# Patient Record
Sex: Male | Born: 1950 | Race: White | Hispanic: No | Marital: Married | Smoking: Former smoker
Health system: Southern US, Community
[De-identification: ages and names within clinical notes are randomized; demographics above are authoritative.]

## PROBLEM LIST (undated history)

## (undated) DIAGNOSIS — F419 Anxiety disorder, unspecified: Secondary | ICD-10-CM

## (undated) DIAGNOSIS — J45909 Unspecified asthma, uncomplicated: Secondary | ICD-10-CM

## (undated) DIAGNOSIS — Z8709 Personal history of other diseases of the respiratory system: Secondary | ICD-10-CM

## (undated) DIAGNOSIS — M1990 Unspecified osteoarthritis, unspecified site: Secondary | ICD-10-CM

## (undated) DIAGNOSIS — I10 Essential (primary) hypertension: Secondary | ICD-10-CM

## (undated) HISTORY — PX: OTHER SURGICAL HISTORY: SHX169

## (undated) HISTORY — PX: FRACTURE SURGERY: SHX138

## (undated) HISTORY — PX: CERVICAL FUSION: SHX112

## (undated) HISTORY — PX: COLONOSCOPY: SHX174

## (undated) HISTORY — PX: TONSILLECTOMY: SUR1361

## (undated) HISTORY — PX: BACK SURGERY: SHX140

---

## 2001-09-28 ENCOUNTER — Ambulatory Visit (HOSPITAL_COMMUNITY): Admission: RE | Admit: 2001-09-28 | Discharge: 2001-09-28 | Payer: Self-pay | Admitting: Plastic Surgery

## 2003-07-26 ENCOUNTER — Emergency Department (HOSPITAL_COMMUNITY): Admission: EM | Admit: 2003-07-26 | Discharge: 2003-07-26 | Payer: Self-pay | Admitting: Emergency Medicine

## 2006-09-11 ENCOUNTER — Ambulatory Visit (HOSPITAL_COMMUNITY): Admission: RE | Admit: 2006-09-11 | Discharge: 2006-09-12 | Payer: Self-pay | Admitting: Neurosurgery

## 2009-01-08 ENCOUNTER — Encounter: Admission: RE | Admit: 2009-01-08 | Discharge: 2009-01-08 | Payer: Self-pay | Admitting: Internal Medicine

## 2011-02-11 NOTE — Op Note (Signed)
NAMEMARQUET, FAIRCLOTH                ACCOUNT NO.:  [id number]   MEDICAL RECORD NO.:  [id number]          PATIENT TYPE:  OIB   LOCATION:  3013                         FACILITY:  [facility]   PHYSICIAN:  Hewitt Shorts, M.D.DATE OF BIRTH:  [birth year]   DATE OF PROCEDURE:  09/11/2006  DATE OF DISCHARGE:                               OPERATIVE REPORT   PREOPERATIVE DIAGNOSES:  C5-6 and C6-7 cervical disk herniation,  cervical spondylosis, cervical degenerative disk disease, and cervical  radiculopathy.   POSTOPERATIVE DIAGNOSES:  C5-6 and C6-7 cervical disk herniation,  cervical spondylosis, cervical degenerative disk disease, and cervical  radiculopathy.   PROCEDURE:  C5-6 and C6-7 anterior cervical diskectomy, arthrodesis with  allograft and Tether cervical plating.   SURGEON:  Hewitt Shorts, M.D.   ASSISTANT:  Webb Silversmith, RN; and Channing Mutters.   ANESTHESIA:  General endotracheal.   INDICATIONS:  Patient is a 60 year old man who presented with right  cervical radiculopathy and was found to have spondylitic disk herniation  superimposed upon underlying spondylosis and degenerative disk disease  at both C5-6 and C6-7 with a soft disk herniation out to the right at C5-  6 as well.  It was felt that these degenerative changes were responsible  for his radiculopathy, and the decision was made to proceed with a two-  level anterior cervical diskectomy and arthrodesis.   PROCEDURE:  The patient was brought to the operating room and placed  under general endotracheal anesthesia.  The patient was placed on 10  pounds of halter traction.  The neck was prepped with Betadine and soapy  solution, and draped in a sterile fashion.  A horizontal incision was  made in the left side of the neck.  The line of the incision was  infiltrated with local anesthetic with epinephrine.  Dissection was  carried down to the subcutaneous tissue and platysma, and bipolar  cautery was used to maintain hemostasis.   Dissection was then carried  down to an avascular plane, leaving the sternocleidomastoid, carotid  artery and jugular vein laterally, and the trachea and esophagus  medially.  The ventral aspect of the vertebral column was identified and  a localization x-ray taken.  The C5-6 and C6-7 intervertebral disk  spaces were identified.  Diskectomy was begun at each level with  incision of the annulus and continued with Mitek curettes and pituitary  rongeurs.  There was osteophytic overgrowth present anteriorly that was  removed using an osteophyte removal tool and double-action rongeur.  The  microscope was draped and brought into the field to provide additional  magnification, illumination, and visualization, and the remainder of the  decompression was performed using microdissection and microsurgical  technique.  The cauterized endplates of the corresponding vertebrae were  removed using Mitek curettes along with the XMax drill, and posterior  osteophytic overgrowth at each level was removed using the XMax drill  along with a 2-mm Kerrison punch with a thin foot plate.  The posterior  longitudinal ligament was carefully opened at each level and the  spondylitic, thickened posterior longitudinal ligament was carefully  removed.  We removed the  disk herniation to the right at C5-6.  In the  end, good decompression of the spinal canal and thecal sac, as well as  of the neuroforamina and nerve roots exiting bilaterally was achieved.   Once the decompression was completed, hemostasis was established with  the use of Gelfoam soaked in thrombin.  We measured the height of the  intervertebral disk spaces and selected an 8-mm allograft for the C5-6  level and a 7-mm allograft for the C6-7 level.  Each allograft was  hydrated in saline solution and then carefully positioned in the  intervertebral disk space and counter-sunk.  We then selected a 35-mm  Tether cervical plate.  Traction was discontinued,  and we secured the  plate to the vertebra with 4 x 15 mm variable angle screws, placing a  pair of screws at C5 and another pair of screws at C7 and a single screw  at C6.  Each screw hole was drilled and tapped and the screws placed in  alternating fashion and then once all five screws were placed, final  tightening was performed.   The wound was irrigated with bacitracin solution.  An x-ray was taken.  It only visualized the C5-6 level and the graft and screws appeared in  good position, and the alignment appeared good.  The wound was again  irrigated with bacitracin solution, checked for hemostasis which was  established and confirmed, and then we proceeded with closure.  The  platysma was closed with interrupted inverted 2-0 undyed Vicryl sutures.  The subcutaneous and subcuticular were closed with interrupted inverted  3-0 undyed Vicryl sutures.  The skin was reapproximated with Dermabond.  The procedure was tolerated well.  The estimated blood loss was 50 cc.  Sponge and needle counts were correct.   Following surgery, the patient was placed in a soft cervical collar,  reversed anesthetic, extubated and transferred to the recovery room for  further care, where he was noted to be moving all four extremities to  command.      Hewitt Shorts, M.D.  Electronically Signed     RWN/MEDQ  D:  09/11/2006  T:  09/12/2006  Job:  [id number]

## 2014-06-19 ENCOUNTER — Other Ambulatory Visit: Payer: Self-pay | Admitting: Neurosurgery

## 2014-06-19 DIAGNOSIS — S129XXA Fracture of neck, unspecified, initial encounter: Secondary | ICD-10-CM

## 2014-06-23 ENCOUNTER — Ambulatory Visit
Admission: RE | Admit: 2014-06-23 | Discharge: 2014-06-23 | Disposition: A | Discharge status: Home / Self Care | Payer: BC Managed Care – PPO | Source: Ambulatory Visit | Attending: Neurosurgery | Admitting: Neurosurgery

## 2014-06-23 ENCOUNTER — Other Ambulatory Visit: Payer: Self-pay | Admitting: Neurosurgery

## 2014-06-23 DIAGNOSIS — S129XXA Fracture of neck, unspecified, initial encounter: Secondary | ICD-10-CM

## 2014-10-07 ENCOUNTER — Other Ambulatory Visit (HOSPITAL_COMMUNITY): Payer: Self-pay | Admitting: Neurosurgery

## 2014-11-06 NOTE — Pre-Procedure Instructions (Signed)
Steve Perez  11/06/2014   Your procedure is scheduled on: Thursday, November 20, 2014   Report to [facility] Admitting at 5:30 AM.   Call this number if you have problems the morning of surgery: [phone]     Remember:   Do not eat food or drink liquids after midnight Wednesday, November 19, 2014    Take these medicines the morning of surgery with A SIP OF WATER: montelukast (SINGULAIR) sertraline (ZOLOFT), if needed:PROVENTIL inhaler for wheezing or shortness of breath, QVAR 80 MCG/ACT inhaler for shortness of breath ( bring inhalers in with you on day of procedure)  Stop taking Aspirin, vitamins, and herbal medications such as Omega-3 Fatty Acids (FISH OIL).  Do not take any NSAIDs ie: Ibuprofen, Advil, Naproxen or any medication containing Aspirin such as meloxicam (MOBIC) and VOLTAREN 1 % GEL   Do not wear jewelry, make-up or nail polish.  Do not wear lotions, powders, or perfumes. You may not wear deodorant.  Do not shave 48 hours prior to surgery. Men may shave face and neck.  Do not bring valuables to the hospital.  [facility] is not responsible for any belongings or valuables.               Contacts, dentures or bridgework may not be worn into surgery.  Leave suitcase in the car. After surgery it may be brought to your room.  For patients admitted to the hospital, discharge time is determined by your treatment team.               Patients discharged the day of surgery will not be allowed to drive home.    Special Instructions:  Special Instructions:Special Instructions: [place] - Preparing for Surgery  Before surgery, you can play an important role.  Because skin is not sterile, your skin needs to be as free of germs as possible.  You can reduce the number of germs on you skin by washing with CHG (chlorahexidine gluconate) soap before surgery.  CHG is an antiseptic cleaner which kills germs and bonds with the skin to continue killing germs even after  washing.  Please DO NOT use if you have an allergy to CHG or antibacterial soaps.  If your skin becomes reddened/irritated stop using the CHG and inform your nurse when you arrive at Short Stay.  Do not shave (including legs and underarms) for at least 48 hours prior to the first CHG shower.  You may shave your face.  Please follow these instructions carefully:   1.  Shower with CHG Soap the night before surgery and the morning of Surgery.  2.  If you choose to wash your hair, wash your hair first as usual with your normal shampoo.  3.  After you shampoo, rinse your hair and body thoroughly to remove the Shampoo.  4.  Use CHG as you would any other liquid soap.  You can apply chg directly  to the skin and wash gently with scrungie or a clean washcloth.  5.  Apply the CHG Soap to your body ONLY FROM THE NECK DOWN.  Do not use on open wounds or open sores.  Avoid contact with your eyes, ears, mouth and genitals (private parts).  Wash genitals (private parts) with your normal soap.  6.  Wash thoroughly, paying special attention to the area where your surgery will be performed.  7.  Thoroughly rinse your body with warm water from the neck down.  8.  DO NOT shower/wash  with your normal soap after using and rinsing off the CHG Soap.  9.  Pat yourself dry with a clean towel.            10.  Wear clean pajamas.            11.  Place clean sheets on your bed the night of your first shower and do not sleep with pets.  Day of Surgery  Do not apply any lotions/deodorants the morning of surgery.  Please wear clean clothes to the hospital/surgery center.   Please read over the following fact sheets that you were given: Pain Booklet, Coughing and Deep Breathing, MRSA Information and Surgical Site Infection Prevention

## 2014-11-07 ENCOUNTER — Encounter (HOSPITAL_COMMUNITY)
Admission: RE | Admit: 2014-11-07 | Discharge: 2014-11-07 | Disposition: A | Discharge status: Home / Self Care | Payer: Medicare HMO | Source: Ambulatory Visit | Attending: Neurosurgery | Admitting: Neurosurgery

## 2014-11-07 ENCOUNTER — Encounter (HOSPITAL_COMMUNITY): Payer: Self-pay

## 2014-11-07 DIAGNOSIS — M5020 Other cervical disc displacement, unspecified cervical region: Secondary | ICD-10-CM | POA: Insufficient documentation

## 2014-11-07 DIAGNOSIS — Z01810 Encounter for preprocedural cardiovascular examination: Secondary | ICD-10-CM | POA: Insufficient documentation

## 2014-11-07 DIAGNOSIS — Z01812 Encounter for preprocedural laboratory examination: Secondary | ICD-10-CM | POA: Insufficient documentation

## 2014-11-07 DIAGNOSIS — R001 Bradycardia, unspecified: Secondary | ICD-10-CM | POA: Insufficient documentation

## 2014-11-07 HISTORY — DX: Unspecified osteoarthritis, unspecified site: M19.90

## 2014-11-07 HISTORY — DX: Anxiety disorder, unspecified: F41.9

## 2014-11-07 HISTORY — DX: Unspecified asthma, uncomplicated: J45.909

## 2014-11-07 HISTORY — DX: Essential (primary) hypertension: I10

## 2014-11-07 LAB — SURGICAL PCR SCREEN
MRSA, PCR: NEGATIVE
Staphylococcus aureus: NEGATIVE

## 2014-11-07 LAB — BASIC METABOLIC PANEL
ANION GAP: 7 (ref 5–15)
BUN: 17 mg/dL (ref 6–23)
CO2: 30 mmol/L (ref 19–32)
CREATININE: 1.02 mg/dL (ref 0.50–1.35)
Calcium: 8.8 mg/dL (ref 8.4–10.5)
Chloride: 100 mmol/L (ref 96–112)
GFR calc Af Amer: 87 mL/min — ABNORMAL LOW (ref >90)
GFR calc non Af Amer: 75 mL/min — ABNORMAL LOW (ref >90)
Glucose, Bld: 101 mg/dL — ABNORMAL HIGH (ref 70–99)
Potassium: 4.9 mmol/L (ref 3.5–5.1)
Sodium: 137 mmol/L (ref 135–145)

## 2014-11-07 LAB — CBC
HEMATOCRIT: 40.7 % (ref 39.0–52.0)
HEMOGLOBIN: 13.6 g/dL (ref 13.0–17.0)
MCH: 32.5 pg (ref 26.0–34.0)
MCHC: 33.4 g/dL (ref 30.0–36.0)
MCV: 97.1 fL (ref 78.0–100.0)
PLATELETS: 192 10*3/uL (ref 150–400)
RBC: 4.19 MIL/uL — AB (ref 4.22–5.81)
RDW: 12.7 % (ref 11.5–15.5)
WBC: 5.8 10*3/uL (ref 4.0–10.5)

## 2014-11-19 MED ORDER — CEFAZOLIN SODIUM-DEXTROSE 2-3 GM-% IV SOLR
2.0000 g | INTRAVENOUS | Status: AC
  Administered 2014-11-20: 2 g via INTRAVENOUS
  Filled 2014-11-19: qty 50

## 2014-11-20 ENCOUNTER — Ambulatory Visit (HOSPITAL_COMMUNITY): Payer: Medicare HMO

## 2014-11-20 ENCOUNTER — Encounter (HOSPITAL_COMMUNITY): Payer: Self-pay | Admitting: Surgery

## 2014-11-20 ENCOUNTER — Encounter (HOSPITAL_COMMUNITY)
Admission: RE | Disposition: A | Discharge status: Home / Self Care | Payer: Self-pay | Source: Ambulatory Visit | Attending: Neurosurgery

## 2014-11-20 ENCOUNTER — Ambulatory Visit (HOSPITAL_COMMUNITY): Payer: Medicare HMO | Admitting: Anesthesiology

## 2014-11-20 ENCOUNTER — Observation Stay (HOSPITAL_COMMUNITY)
Admission: RE | Admit: 2014-11-20 | Discharge: 2014-11-21 | Disposition: A | Discharge status: Home / Self Care | Payer: Medicare HMO | Source: Ambulatory Visit | Attending: Neurosurgery | Admitting: Neurosurgery

## 2014-11-20 DIAGNOSIS — F17210 Nicotine dependence, cigarettes, uncomplicated: Secondary | ICD-10-CM | POA: Insufficient documentation

## 2014-11-20 DIAGNOSIS — I10 Essential (primary) hypertension: Secondary | ICD-10-CM | POA: Diagnosis not present

## 2014-11-20 DIAGNOSIS — Z419 Encounter for procedure for purposes other than remedying health state, unspecified: Secondary | ICD-10-CM

## 2014-11-20 DIAGNOSIS — F419 Anxiety disorder, unspecified: Secondary | ICD-10-CM | POA: Insufficient documentation

## 2014-11-20 DIAGNOSIS — M1990 Unspecified osteoarthritis, unspecified site: Secondary | ICD-10-CM | POA: Insufficient documentation

## 2014-11-20 DIAGNOSIS — M5020 Other cervical disc displacement, unspecified cervical region: Secondary | ICD-10-CM | POA: Diagnosis present

## 2014-11-20 DIAGNOSIS — M5012 Cervical disc disorder with radiculopathy, mid-cervical region: Secondary | ICD-10-CM | POA: Diagnosis not present

## 2014-11-20 DIAGNOSIS — Z7982 Long term (current) use of aspirin: Secondary | ICD-10-CM | POA: Insufficient documentation

## 2014-11-20 DIAGNOSIS — J45909 Unspecified asthma, uncomplicated: Secondary | ICD-10-CM | POA: Diagnosis not present

## 2014-11-20 HISTORY — PX: ANTERIOR CERVICAL DECOMP/DISCECTOMY FUSION: SHX1161

## 2014-11-20 SURGERY — ANTERIOR CERVICAL DECOMPRESSION/DISCECTOMY FUSION 2 LEVEL/HARDWARE REMOVAL
Anesthesia: General | Site: Spine Cervical

## 2014-11-20 MED ORDER — MIDAZOLAM HCL 5 MG/5ML IJ SOLN
INTRAMUSCULAR | Status: DC | PRN
  Administered 2014-11-20: 2 mg via INTRAVENOUS

## 2014-11-20 MED ORDER — OXYCODONE HCL 5 MG PO TABS: 5.0000 mg | ORAL_TABLET | Freq: Once | ORAL | Status: DC | PRN

## 2014-11-20 MED ORDER — GLYCOPYRROLATE 0.2 MG/ML IJ SOLN
INTRAMUSCULAR | Status: AC
  Filled 2014-11-20: qty 1

## 2014-11-20 MED ORDER — 0.9 % SODIUM CHLORIDE (POUR BTL) OPTIME
TOPICAL | Status: DC | PRN
  Administered 2014-11-20: 1000 mL

## 2014-11-20 MED ORDER — ALUM & MAG HYDROXIDE-SIMETH 200-200-20 MG/5ML PO SUSP
30.0000 mL | Freq: Four times a day (QID) | ORAL | Status: DC | PRN
  Administered 2014-11-21: 30 mL via ORAL
  Filled 2014-11-20: qty 30

## 2014-11-20 MED ORDER — BUPIVACAINE HCL (PF) 0.5 % IJ SOLN
INTRAMUSCULAR | Status: DC | PRN
Start: 2014-11-20 — End: 2014-11-20
  Administered 2014-11-20: 8 mL

## 2014-11-20 MED ORDER — OXYCODONE-ACETAMINOPHEN 5-325 MG PO TABS
1.0000 | ORAL_TABLET | ORAL | Status: DC | PRN
  Administered 2014-11-20 (×2): 2 via ORAL
  Filled 2014-11-20: qty 2

## 2014-11-20 MED ORDER — HYDROMORPHONE HCL 1 MG/ML IJ SOLN
0.2500 mg | INTRAMUSCULAR | Status: DC | PRN
  Administered 2014-11-20 (×4): 0.5 mg via INTRAVENOUS

## 2014-11-20 MED ORDER — BISACODYL 10 MG RE SUPP
10.0000 mg | Freq: Every day | RECTAL | Status: DC | PRN
Start: 2014-11-20 — End: 2014-11-21

## 2014-11-20 MED ORDER — ZOLPIDEM TARTRATE 5 MG PO TABS
5.0000 mg | ORAL_TABLET | Freq: Every evening | ORAL | Status: DC | PRN
  Administered 2014-11-20: 5 mg via ORAL
  Filled 2014-11-20: qty 1

## 2014-11-20 MED ORDER — ALBUTEROL SULFATE (2.5 MG/3ML) 0.083% IN NEBU: 2.5000 mg | INHALATION_SOLUTION | Freq: Four times a day (QID) | RESPIRATORY_TRACT | Status: DC | PRN

## 2014-11-20 MED ORDER — FENTANYL CITRATE 0.05 MG/ML IJ SOLN
INTRAMUSCULAR | Status: AC
  Filled 2014-11-20: qty 5

## 2014-11-20 MED ORDER — PHENOL 1.4 % MT LIQD: 1.0000 | OROMUCOSAL | Status: DC | PRN

## 2014-11-20 MED ORDER — SODIUM CHLORIDE 0.9 % IJ SOLN
3.0000 mL | Freq: Two times a day (BID) | INTRAMUSCULAR | Status: DC
  Administered 2014-11-20 (×2): 3 mL via INTRAVENOUS

## 2014-11-20 MED ORDER — GLYCOPYRROLATE 0.2 MG/ML IJ SOLN
INTRAMUSCULAR | Status: AC
  Filled 2014-11-20: qty 4

## 2014-11-20 MED ORDER — LACTATED RINGERS IV SOLN
INTRAVENOUS | Status: DC | PRN
  Administered 2014-11-20 (×2): via INTRAVENOUS

## 2014-11-20 MED ORDER — ARTIFICIAL TEARS OP OINT
TOPICAL_OINTMENT | OPHTHALMIC | Status: AC
  Filled 2014-11-20: qty 3.5

## 2014-11-20 MED ORDER — LIDOCAINE HCL (CARDIAC) 20 MG/ML IV SOLN
INTRAVENOUS | Status: DC | PRN
  Administered 2014-11-20: 100 mg via INTRAVENOUS

## 2014-11-20 MED ORDER — ONDANSETRON HCL 4 MG/2ML IJ SOLN
INTRAMUSCULAR | Status: AC
  Filled 2014-11-20: qty 2

## 2014-11-20 MED ORDER — GLYCOPYRROLATE 0.2 MG/ML IJ SOLN
INTRAMUSCULAR | Status: DC | PRN
  Administered 2014-11-20: 0.2 mg via INTRAVENOUS
  Administered 2014-11-20: .8 mg via INTRAVENOUS

## 2014-11-20 MED ORDER — SERTRALINE HCL 50 MG PO TABS
150.0000 mg | ORAL_TABLET | Freq: Every day | ORAL | Status: DC
  Filled 2014-11-20: qty 1

## 2014-11-20 MED ORDER — ACETAMINOPHEN 325 MG PO TABS: 650.0000 mg | ORAL_TABLET | ORAL | Status: DC | PRN

## 2014-11-20 MED ORDER — HYDROMORPHONE HCL 1 MG/ML IJ SOLN
INTRAMUSCULAR | Status: AC
  Administered 2014-11-20: 0.5 mg via INTRAVENOUS
  Filled 2014-11-20: qty 1

## 2014-11-20 MED ORDER — EPHEDRINE SULFATE 50 MG/ML IJ SOLN
INTRAMUSCULAR | Status: DC | PRN
  Administered 2014-11-20: 15 mg via INTRAVENOUS
  Administered 2014-11-20: 10 mg via INTRAVENOUS
  Administered 2014-11-20 (×2): 5 mg via INTRAVENOUS

## 2014-11-20 MED ORDER — LIDOCAINE-EPINEPHRINE 1 %-1:100000 IJ SOLN
INTRAMUSCULAR | Status: DC | PRN
  Administered 2014-11-20: 8 mL

## 2014-11-20 MED ORDER — HYDROCODONE-ACETAMINOPHEN 5-325 MG PO TABS: 1.0000 | ORAL_TABLET | ORAL | Status: DC | PRN

## 2014-11-20 MED ORDER — NEOSTIGMINE METHYLSULFATE 10 MG/10ML IV SOLN
INTRAVENOUS | Status: AC
  Filled 2014-11-20: qty 1

## 2014-11-20 MED ORDER — SUCCINYLCHOLINE CHLORIDE 20 MG/ML IJ SOLN
INTRAMUSCULAR | Status: AC
  Filled 2014-11-20: qty 1

## 2014-11-20 MED ORDER — ONDANSETRON HCL 4 MG/2ML IJ SOLN
4.0000 mg | Freq: Four times a day (QID) | INTRAMUSCULAR | Status: DC | PRN
Start: 2014-11-20 — End: 2014-11-21

## 2014-11-20 MED ORDER — SODIUM CHLORIDE 0.9 % IJ SOLN
INTRAMUSCULAR | Status: AC
  Filled 2014-11-20: qty 10

## 2014-11-20 MED ORDER — ROCURONIUM BROMIDE 50 MG/5ML IV SOLN
INTRAVENOUS | Status: AC
  Filled 2014-11-20: qty 1

## 2014-11-20 MED ORDER — CYCLOBENZAPRINE HCL 10 MG PO TABS
ORAL_TABLET | ORAL | Status: AC
  Administered 2014-11-20: 10 mg via ORAL
  Filled 2014-11-20: qty 1

## 2014-11-20 MED ORDER — DEXTROSE-NACL 5-0.45 % IV SOLN: INTRAVENOUS | Status: DC

## 2014-11-20 MED ORDER — KETOROLAC TROMETHAMINE 30 MG/ML IJ SOLN
30.0000 mg | Freq: Once | INTRAMUSCULAR | Status: AC
  Administered 2014-11-20: 30 mg via INTRAVENOUS

## 2014-11-20 MED ORDER — SODIUM CHLORIDE 0.9 % IR SOLN
Status: DC | PRN
  Administered 2014-11-20: 500 mL

## 2014-11-20 MED ORDER — VECURONIUM BROMIDE 10 MG IV SOLR
INTRAVENOUS | Status: DC | PRN
  Administered 2014-11-20: 3 mg via INTRAVENOUS
  Administered 2014-11-20: 2 mg via INTRAVENOUS
  Administered 2014-11-20: 3 mg via INTRAVENOUS
  Administered 2014-11-20 (×2): 2 mg via INTRAVENOUS

## 2014-11-20 MED ORDER — ROCURONIUM BROMIDE 100 MG/10ML IV SOLN
INTRAVENOUS | Status: DC | PRN
  Administered 2014-11-20: 50 mg via INTRAVENOUS

## 2014-11-20 MED ORDER — ACETAMINOPHEN 10 MG/ML IV SOLN
INTRAVENOUS | Status: AC
  Administered 2014-11-20: 1000 mg via INTRAVENOUS
  Filled 2014-11-20: qty 100

## 2014-11-20 MED ORDER — LIDOCAINE HCL (CARDIAC) 20 MG/ML IV SOLN
INTRAVENOUS | Status: AC
  Filled 2014-11-20: qty 5

## 2014-11-20 MED ORDER — MIDAZOLAM HCL 2 MG/2ML IJ SOLN
INTRAMUSCULAR | Status: AC
  Filled 2014-11-20: qty 2

## 2014-11-20 MED ORDER — OXYCODONE-ACETAMINOPHEN 5-325 MG PO TABS
ORAL_TABLET | ORAL | Status: AC
  Administered 2014-11-20: 2 via ORAL
  Filled 2014-11-20: qty 2

## 2014-11-20 MED ORDER — MENTHOL 3 MG MT LOZG: 1.0000 | LOZENGE | OROMUCOSAL | Status: DC | PRN

## 2014-11-20 MED ORDER — MONTELUKAST SODIUM 10 MG PO TABS
10.0000 mg | ORAL_TABLET | Freq: Every day | ORAL | Status: DC
  Filled 2014-11-20: qty 1

## 2014-11-20 MED ORDER — CYCLOBENZAPRINE HCL 10 MG PO TABS
10.0000 mg | ORAL_TABLET | Freq: Three times a day (TID) | ORAL | Status: DC | PRN
  Administered 2014-11-20: 10 mg via ORAL

## 2014-11-20 MED ORDER — HYDROXYZINE HCL 50 MG/ML IM SOLN
50.0000 mg | INTRAMUSCULAR | Status: DC | PRN
  Filled 2014-11-20: qty 1

## 2014-11-20 MED ORDER — ACETAMINOPHEN 650 MG RE SUPP: 650.0000 mg | RECTAL | Status: DC | PRN

## 2014-11-20 MED ORDER — HYDROXYZINE HCL 25 MG PO TABS: 50.0000 mg | ORAL_TABLET | ORAL | Status: DC | PRN

## 2014-11-20 MED ORDER — MORPHINE SULFATE 4 MG/ML IJ SOLN: 4.0000 mg | INTRAMUSCULAR | Status: DC | PRN

## 2014-11-20 MED ORDER — ONDANSETRON HCL 4 MG/2ML IJ SOLN
INTRAMUSCULAR | Status: DC | PRN
  Administered 2014-11-20: 4 mg via INTRAVENOUS

## 2014-11-20 MED ORDER — THROMBIN 20000 UNITS EX SOLR
CUTANEOUS | Status: DC | PRN
  Administered 2014-11-20: 20 mL via TOPICAL

## 2014-11-20 MED ORDER — KETOROLAC TROMETHAMINE 30 MG/ML IJ SOLN
30.0000 mg | Freq: Four times a day (QID) | INTRAMUSCULAR | Status: DC
  Administered 2014-11-20 – 2014-11-21 (×3): 30 mg via INTRAVENOUS
  Filled 2014-11-20 (×6): qty 1

## 2014-11-20 MED ORDER — ALBUTEROL SULFATE HFA 108 (90 BASE) MCG/ACT IN AERS: 2.0000 | INHALATION_SPRAY | Freq: Four times a day (QID) | RESPIRATORY_TRACT | Status: DC | PRN

## 2014-11-20 MED ORDER — KETOROLAC TROMETHAMINE 30 MG/ML IJ SOLN
INTRAMUSCULAR | Status: AC
  Administered 2014-11-20: 30 mg via INTRAVENOUS
  Filled 2014-11-20: qty 1

## 2014-11-20 MED ORDER — STERILE WATER FOR INJECTION IJ SOLN
INTRAMUSCULAR | Status: AC
  Filled 2014-11-20: qty 10

## 2014-11-20 MED ORDER — THROMBIN 5000 UNITS EX SOLR
CUTANEOUS | Status: DC | PRN
  Administered 2014-11-20: 5 mL via TOPICAL

## 2014-11-20 MED ORDER — MAGNESIUM HYDROXIDE 400 MG/5ML PO SUSP: 30.0000 mL | Freq: Every day | ORAL | Status: DC | PRN

## 2014-11-20 MED ORDER — LISINOPRIL 20 MG PO TABS
20.0000 mg | ORAL_TABLET | Freq: Every day | ORAL | Status: DC
  Filled 2014-11-20 (×2): qty 1

## 2014-11-20 MED ORDER — ARTIFICIAL TEARS OP OINT
TOPICAL_OINTMENT | OPHTHALMIC | Status: DC | PRN
Start: 2014-11-20 — End: 2014-11-20
  Administered 2014-11-20: 1 via OPHTHALMIC

## 2014-11-20 MED ORDER — FENTANYL CITRATE 0.05 MG/ML IJ SOLN
INTRAMUSCULAR | Status: DC | PRN
  Administered 2014-11-20: 100 ug via INTRAVENOUS
  Administered 2014-11-20 (×3): 50 ug via INTRAVENOUS
  Administered 2014-11-20: 100 ug via INTRAVENOUS

## 2014-11-20 MED ORDER — PROMETHAZINE HCL 25 MG/ML IJ SOLN: 6.2500 mg | INTRAMUSCULAR | Status: DC | PRN

## 2014-11-20 MED ORDER — PROPOFOL 10 MG/ML IV BOLUS
INTRAVENOUS | Status: AC
  Filled 2014-11-20: qty 20

## 2014-11-20 MED ORDER — EPHEDRINE SULFATE 50 MG/ML IJ SOLN
INTRAMUSCULAR | Status: AC
  Filled 2014-11-20: qty 1

## 2014-11-20 MED ORDER — VECURONIUM BROMIDE 10 MG IV SOLR
INTRAVENOUS | Status: AC
  Filled 2014-11-20: qty 10

## 2014-11-20 MED ORDER — PROPOFOL 10 MG/ML IV BOLUS
INTRAVENOUS | Status: DC | PRN
  Administered 2014-11-20: 200 mg via INTRAVENOUS

## 2014-11-20 MED ORDER — ATORVASTATIN CALCIUM 20 MG PO TABS
20.0000 mg | ORAL_TABLET | Freq: Every day | ORAL | Status: DC
  Filled 2014-11-20 (×2): qty 1

## 2014-11-20 MED ORDER — SODIUM CHLORIDE 0.9 % IJ SOLN: 3.0000 mL | INTRAMUSCULAR | Status: DC | PRN

## 2014-11-20 MED ORDER — NEOSTIGMINE METHYLSULFATE 10 MG/10ML IV SOLN
INTRAVENOUS | Status: DC | PRN
  Administered 2014-11-20: 5 mg via INTRAVENOUS

## 2014-11-20 MED ORDER — OXYCODONE HCL 5 MG/5ML PO SOLN: 5.0000 mg | Freq: Once | ORAL | Status: DC | PRN

## 2014-11-20 SURGICAL SUPPLY — 68 items
ADH SKN CLS LQ APL DERMABOND (GAUZE/BANDAGES/DRESSINGS) ×1
ALLOGRAFT 5X14X11 (Bone Implant) ×1 IMPLANT
BAG DECANTER FOR FLEXI CONT (MISCELLANEOUS) ×2 IMPLANT
BIT DRILL NEURO 2X3.1 SFT TUCH (MISCELLANEOUS) ×1 IMPLANT
BLADE ULTRA TIP 2M (BLADE) ×2 IMPLANT
BRUSH SCRUB EZ PLAIN DRY (MISCELLANEOUS) ×2 IMPLANT
CANISTER SUCT 3000ML PPV (MISCELLANEOUS) ×2 IMPLANT
CONT SPEC 4OZ CLIKSEAL STRL BL (MISCELLANEOUS) ×2 IMPLANT
COVER MAYO STAND STRL (DRAPES) ×2 IMPLANT
DECANTER SPIKE VIAL GLASS SM (MISCELLANEOUS) ×2 IMPLANT
DERMABOND ADHESIVE PROPEN (GAUZE/BANDAGES/DRESSINGS) ×1
DERMABOND ADVANCED .7 DNX6 (GAUZE/BANDAGES/DRESSINGS) IMPLANT
DRAPE C-ARM 42X72 X-RAY (DRAPES) ×2 IMPLANT
DRAPE INCISE IOBAN 66X45 STRL (DRAPES) ×1 IMPLANT
DRAPE LAPAROTOMY 100X72 PEDS (DRAPES) ×2 IMPLANT
DRAPE MICROSCOPE LEICA (MISCELLANEOUS) ×2 IMPLANT
DRAPE POUCH INSTRU U-SHP 10X18 (DRAPES) ×2 IMPLANT
DRAPE PROXIMA HALF (DRAPES) ×1 IMPLANT
DRILL NEURO 2X3.1 SOFT TOUCH (MISCELLANEOUS) ×2
ELECT COATED BLADE 2.86 ST (ELECTRODE) ×2 IMPLANT
ELECT REM PT RETURN 9FT ADLT (ELECTROSURGICAL) ×2
ELECTRODE REM PT RTRN 9FT ADLT (ELECTROSURGICAL) ×1 IMPLANT
GLOVE BIO SURGEON STRL SZ7 (GLOVE) ×1 IMPLANT
GLOVE BIO SURGEON STRL SZ8.5 (GLOVE) ×1 IMPLANT
GLOVE BIOGEL PI IND STRL 7.5 (GLOVE) IMPLANT
GLOVE BIOGEL PI IND STRL 8 (GLOVE) ×1 IMPLANT
GLOVE BIOGEL PI INDICATOR 7.5 (GLOVE) ×2
GLOVE BIOGEL PI INDICATOR 8 (GLOVE) ×2
GLOVE ECLIPSE 7.5 STRL STRAW (GLOVE) ×3 IMPLANT
GLOVE EXAM NITRILE LRG STRL (GLOVE) IMPLANT
GLOVE EXAM NITRILE MD LF STRL (GLOVE) IMPLANT
GLOVE EXAM NITRILE XL STR (GLOVE) IMPLANT
GLOVE EXAM NITRILE XS STR PU (GLOVE) IMPLANT
GLOVE SS BIOGEL STRL SZ 8 (GLOVE) IMPLANT
GLOVE SUPERSENSE BIOGEL SZ 8 (GLOVE) ×1
GLOVE SURG SS PI 7.0 STRL IVOR (GLOVE) ×3 IMPLANT
GOWN STRL REUS W/ TWL LRG LVL3 (GOWN DISPOSABLE) IMPLANT
GOWN STRL REUS W/ TWL XL LVL3 (GOWN DISPOSABLE) IMPLANT
GOWN STRL REUS W/TWL 2XL LVL3 (GOWN DISPOSABLE) IMPLANT
GOWN STRL REUS W/TWL LRG LVL3 (GOWN DISPOSABLE)
GOWN STRL REUS W/TWL XL LVL3 (GOWN DISPOSABLE) ×12
GRAFT CORT CANC 14X8.25X11 5D (Bone Implant) ×1 IMPLANT
HALTER HD/CHIN CERV TRACTION D (MISCELLANEOUS) ×2 IMPLANT
HEMOSTAT POWDER SURGIFOAM 1G (HEMOSTASIS) ×1 IMPLANT
KIT BASIN OR (CUSTOM PROCEDURE TRAY) ×2 IMPLANT
KIT ROOM TURNOVER OR (KITS) ×2 IMPLANT
LIQUID BAND (GAUZE/BANDAGES/DRESSINGS) ×1 IMPLANT
NDL HYPO 25X1 1.5 SAFETY (NEEDLE) ×1 IMPLANT
NDL SPNL 22GX3.5 QUINCKE BK (NEEDLE) ×1 IMPLANT
NEEDLE HYPO 25X1 1.5 SAFETY (NEEDLE) ×2 IMPLANT
NEEDLE SPNL 22GX3.5 QUINCKE BK (NEEDLE) ×4 IMPLANT
NS IRRIG 1000ML POUR BTL (IV SOLUTION) ×2 IMPLANT
PACK LAMINECTOMY NEURO (CUSTOM PROCEDURE TRAY) ×2 IMPLANT
PAD ARMBOARD 7.5X6 YLW CONV (MISCELLANEOUS) ×6 IMPLANT
PATTIES SURGICAL .5 X.5 (GAUZE/BANDAGES/DRESSINGS) ×1 IMPLANT
PLATE CERVICAL 32MM (Plate) ×1 IMPLANT
RUBBERBAND STERILE (MISCELLANEOUS) ×4 IMPLANT
SCREW FIX 4.0X17MM (Screw) ×1 IMPLANT
SCREW VAR 4.0X17MM (Screw) ×4 IMPLANT
SPONGE INTESTINAL PEANUT (DISPOSABLE) ×3 IMPLANT
SPONGE SURGIFOAM ABS GEL 100 (HEMOSTASIS) ×2 IMPLANT
STAPLER SKIN PROX WIDE 3.9 (STAPLE) IMPLANT
SUT VIC AB 2-0 CP2 18 (SUTURE) ×2 IMPLANT
SUT VIC AB 3-0 SH 8-18 (SUTURE) ×2 IMPLANT
SYR 20ML ECCENTRIC (SYRINGE) ×2 IMPLANT
TOWEL OR 17X24 6PK STRL BLUE (TOWEL DISPOSABLE) ×3 IMPLANT
TOWEL OR 17X26 10 PK STRL BLUE (TOWEL DISPOSABLE) ×2 IMPLANT
WATER STERILE IRR 1000ML POUR (IV SOLUTION) ×2 IMPLANT

## 2014-11-20 NOTE — Progress Notes (Signed)
Filed Vitals:   11/20/14 1200 11/20/14 1215 11/20/14 1230 11/20/14 1255  BP: 142/61   124/65  Pulse: 76 70 73 70  Temp:    97.9 F (36.6 C)  TempSrc:      Resp: 21 16 18 18   SpO2: 96% 97% 97% 96%    Patient resting in bed comfortably. Has been up and ambulating in the halls with a staff. No void yet. Wound clean and dry; no erythema, swelling, or drainage. Moving all extremities well.  Plan: Encouraged to ambulate several more times this afternoon and this evening. Nursing staff to monitor voiding function.  Hosie Spangle, MD 11/20/2014, 4:16 PM

## 2014-11-20 NOTE — Anesthesia Preprocedure Evaluation (Signed)
Anesthesia Evaluation  Patient identified by MRN, date of birth, ID band Patient awake    Reviewed: Allergy & Precautions, NPO status , Patient's Chart, lab work & pertinent test results  Airway Mallampati: I       Dental  (+) Teeth Intact, Caps, Dental Advisory Given   Pulmonary asthma , Current Smoker,  breath sounds clear to auscultation        Cardiovascular hypertension, Rhythm:Regular Rate:Normal     Neuro/Psych    GI/Hepatic   Endo/Other    Renal/GU      Musculoskeletal  (+) Arthritis -,   Abdominal   Peds  Hematology   Anesthesia Other Findings   Reproductive/Obstetrics                             Anesthesia Physical Anesthesia Plan  ASA: II  Anesthesia Plan: General   Post-op Pain Management:    Induction: Intravenous  Airway Management Planned: Oral ETT  Additional Equipment:   Intra-op Plan:   Post-operative Plan: Extubation in OR  Informed Consent: I have reviewed the patients History and Physical, chart, labs and discussed the procedure including the risks, benefits and alternatives for the proposed anesthesia with the patient or authorized representative who has indicated his/her understanding and acceptance.     Plan Discussed with: CRNA and Surgeon  Anesthesia Plan Comments:         Anesthesia Quick Evaluation

## 2014-11-20 NOTE — Anesthesia Procedure Notes (Signed)
Procedure Name: Intubation Date/Time: 11/20/2014 7:31 AM Performed by: Barrington Ellison Pre-anesthesia Checklist: Patient identified, Emergency Drugs available, Suction available, Patient being monitored and Timeout performed Patient Re-evaluated:Patient Re-evaluated prior to inductionOxygen Delivery Method: Circle system utilized Preoxygenation: Pre-oxygenation with 100% oxygen Intubation Type: IV induction Ventilation: Mask ventilation without difficulty and Oral airway inserted - appropriate to patient size Laryngoscope Size: Mac and 3 Grade View: Grade I Tube type: Oral Tube size: 7.5 mm Number of attempts: 1 Airway Equipment and Method: Stylet Placement Confirmation: ETT inserted through vocal cords under direct vision,  positive ETCO2 and breath sounds checked- equal and bilateral Secured at: 23 cm Tube secured with: Tape Dental Injury: Teeth and Oropharynx as per pre-operative assessment

## 2014-11-20 NOTE — Anesthesia Postprocedure Evaluation (Signed)
  Anesthesia Post-op Note  Patient: Steve Perez  Procedure(s) Performed: Procedure(s): ANTERIOR CERVICAL DECOMPRESSION/DISCECTOMY FUSION CERVICAL FOUR-FIVE,CERVICAL FIVE-SIX WITH/HARDWARE REMOVAL  AT CERVICAL FIVE-SIX,CERVICAL SIX-SEVEN. (N/A)  Patient Location: PACU  Anesthesia Type:General  Level of Consciousness: awake and alert   Airway and Oxygen Therapy: Patient Spontanous Breathing  Post-op Pain: mild  Post-op Assessment: Post-op Vital signs reviewed  Post-op Vital Signs: stable  Last Vitals:  Filed Vitals:   11/20/14 1130  BP: 124/63  Pulse: 70  Temp:   Resp: 11    Complications: No apparent anesthesia complications

## 2014-11-20 NOTE — Op Note (Signed)
11/20/2014  10:23 AM  PATIENT:  Steve Perez  64 y.o. 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  ANESTHESIA:   general  EBL:  Total I/O In: 1000 [I.V.:1000] Out: 50 [Blood:50]  BLOOD ADMINISTERED:none  COUNT: Correct per nursing staff  DICTATION: Patient was brought to the operating room placed under general endotracheal anesthesia. Patient was placed in 10 pounds of halter traction. The neck was prepped with Betadine soap and solution and draped in a sterile fashion. An oblique incision was made on the left side of the neck. The line of the incision was infiltrated with local anesthetic with epinephrine. Dissection was carried down thru the subcutaneous tissue and platysma, bipolar cautery was used to maintain hemostasis. Dissection was then carried out thru an avascular plane leaving the sternocleidomastoid carotid artery and jugular vein laterally and the trachea and esophagus medially. The ventral aspect of the vertebral column was identified, the existing anterior cervical plate from I7-1 was identified. The C4-5 level was identified. I dissected the scar tissue from over the existing anterior cervical plate, and then we removed the  screws, plugging each of the screw holes with Gelfoam with thrombin to establish hemostasis, and then removed the anterior cervical plate. The annulus at the C4-5 level was incised and the disc space entered. Discectomy was performed with micro-curettes and pituitary rongeurs. The operating microscope was draped and brought into the field provided additional magnification illumination and visualization. Discectomy was continued posteriorly thru the disc space and then the cartilaginous endplate was removed using micro-curettes along with the high-speed drill. Posterior osteophytic overgrowth was removed each level using the high-speed drill along with a 2 mm thin footplated Kerrison punch. Posterior longitudinal ligament along with disc herniation was carefully removed, decompressing the spinal canal and thecal sac. We then continued to remove osteophytic overgrowth and disc material decompressing the neural foramina and exiting nerve roots bilaterally. Once the decompression was completed hemostasis was established at the C4-5 level with the use of Gelfoam with thrombin and Surgifoam. Hemostasis confirmed. We then measured the height of the intravertebral disc space level and selected a 8 millimeter in height structural allograft for the C4-5 level. It was hydrated in saline solution and then gently positioned in the intravertebral disc space and countersunk.   The C-arm fluoroscope was then draped and brought in the field to identify the C5-6 nonunion and pseudoarthrosis. With C-arm fluoroscopic guidance we drilled out the scar tissue within the pseudoarthrosis, drilling down to expose good bony surfaces on the undersurface of C5, and the upper surface of C6. We measured the height of the intravertebral space, and selected a 5 mm in height structural allograft for the C5-6 level. It was hydrated and saline solution and then gently positioned in the intravertebral space.  We then selected a 32 millimeter in  height Tether cervical plate. It was positioned over the fusion construct and secured to the vertebra with a pair of 4 x 17 mm variable  screws at the C4 level, a single 4 x 17 mm fixed screw at the C5 level, and a pair of 4 x 17 mm variable screws at the C6 level. Each screw hole was started with the high-speed drill and then the screws placed, once all the screws were placed final tightening was performed. The wound was irrigated with bacitracin solution checked for hemostasis which was established and confirmed. Using the C-arm fluoroscope, the plate and screws were noted to be in good position, the grafts were in good position, and the overall alignment was good. We then proceeded with closure. The platysma was closed with interrupted inverted 2-0 undyed Vicryl suture, the subcutaneous and subcuticular closed with interrupted inverted 3-0 undyed Vicryl suture. The skin edges were approximated with Dermabond. Following surgery the patient was taken out of cervical traction. To be reversed and the anesthetic and taken to the recovery room for further care.  PLAN OF CARE: Admit for overnight observation  PATIENT DISPOSITION:  PACU - hemodynamically stable.   Delay start of Pharmacological VTE agent (>24hrs) due to surgical blood loss or risk of bleeding:  yes

## 2014-11-20 NOTE — Plan of Care (Signed)
Problem: Consults Goal: Diagnosis - Spinal Surgery Outcome: Completed/Met Date Met:  11/20/14 Cervical Spine Fusion

## 2014-11-20 NOTE — Transfer of Care (Signed)
Immediate Anesthesia Transfer of Care Note  Patient: Steve Perez  Procedure(s) Performed: Procedure(s): ANTERIOR CERVICAL DECOMPRESSION/DISCECTOMY FUSION CERVICAL FOUR-FIVE,CERVICAL FIVE-SIX WITH/HARDWARE REMOVAL  AT CERVICAL FIVE-SIX,CERVICAL SIX-SEVEN. (N/A)  Patient Location: PACU  Anesthesia Type:General  Level of Consciousness: awake and oriented  Airway & Oxygen Therapy: Patient Spontanous Breathing and Patient connected to nasal cannula oxygen  Post-op Assessment: Report given to RN and Patient moving all extremities X 4  Post vital signs: Reviewed and stable  Last Vitals:  Filed Vitals:   11/20/14 0559  BP: 125/65  Pulse: 59  Temp: 36.8 C  Resp: 20    Complications: No apparent anesthesia complications

## 2014-11-20 NOTE — H&P (Signed)
Subjective: Patient is a 64 y.o. male who is admitted for treatment of neck pain and bilateral cervical radiculopathy, with pain, numbness, and tingling through the upper extremities bilaterally. 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 Patient is admitted now for removal of his existing C5-C7 anterior cervical plate, revision of his C5-6 fusion, and a C4-5 anterior cervical discectomy and arthrodesis with allograft and cervical plating C4-5 and C5-6.    Past Medical History  Diagnosis Date  . Hypertension   . Anxiety     panic attacks  . Arthritis   . Asthma     Past Surgical History  Procedure Laterality Date  . Cervical fusion    . Fracture surgery Right     right ankle  . Arthroscopic knee Left   . Tonsillectomy      Prescriptions prior to admission  Medication Sig Dispense Refill Last Dose  . Ascorbic Acid (VITAMIN C PO) Take 1 tablet by mouth daily.   Past Week at Unknown time  . aspirin EC 81 MG tablet Take 81 mg by mouth daily.   Past Week at Unknown time  . atorvastatin (LIPITOR) 20 MG tablet Take 20 mg by mouth daily.  2 11/19/2014 at Unknown time  . B Complex-C (SUPER B COMPLEX PO) Take 1 tablet by mouth daily.   Past Week at Unknown time  . CIALIS 20 MG tablet Take 20 mg by mouth daily as needed for erectile dysfunction.   1   . lisinopril (PRINIVIL,ZESTRIL) 20 MG tablet Take 20 mg by mouth daily.  2 11/19/2014 at Unknown time  . meloxicam (MOBIC) 15 MG tablet Take 15 mg by mouth daily.  5 Past Week at Unknown time  . montelukast (SINGULAIR) 10 MG tablet Take 10 mg by mouth daily.  2 11/20/2014 at 0500  . Multiple Vitamins-Minerals (MULTIVITAMIN PO) Take 1 tablet by mouth daily.   Past Week at Unknown time  . Omega-3 Fatty Acids (FISH OIL PO) Take 1  capsule by mouth daily.   Past Week at Unknown time  . sertraline (ZOLOFT) 100 MG tablet Take 150 mg by mouth daily.  2 11/20/2014 at 0500  . VOLTAREN 1 % GEL Apply 2 g topically 3 (three) times daily as needed (pain).   6 Past Week at Unknown time  . PROVENTIL HFA 108 (90 BASE) MCG/ACT inhaler Inhale 2 puffs into the lungs every 6 (six) hours as needed for wheezing or shortness of breath.   11 More than a month at Unknown time  . QVAR 80 MCG/ACT inhaler Inhale 1 puff into the lungs daily as needed (shortness of breath).   11 More than a month at Unknown time   No Known Allergies  History  Substance Use Topics  . Smoking status: Current Every Day Smoker    Types: Cigars  . Smokeless tobacco: Not on file  . Alcohol Use: Yes     Comment: socially    History reviewed. No pertinent family history.   Review of Systems A comprehensive review of systems was negative.  Objective: Vital signs in last 24 hours: Temp:  [98.2 F (36.8 C)] 98.2 F (36.8 C) (02/25 0559) Pulse Rate:  [59] 59 (02/25 0559) Resp:  [20] 20 (02/25 0559) BP: (125)/(65) 125/65 mmHg (02/25 0559) SpO2:  [  100 %] 100 % (02/25 0559)  EXAM: Patient well-developed well-nourished white male in no acute distress. Lungs are clear to auscultation , the patient has symmetrical respiratory excursion. Heart has a regular rate and rhythm normal S1 and S2 no murmur.   Abdomen is soft nontender nondistended bowel sounds are present. Extremity examination shows no clubbing cyanosis or edema. Motor examination shows 5 over 5 strength in the upper extremities including the deltoid biceps triceps and intrinsics and grip. Sensation is intact to pinprick throughout the digits of the upper extremities. Reflexes are symmetrical and without evidence of pathologic reflexes. Patient has a normal gait and stance.   Data Review:CBC    Component Value Date/Time   WBC 5.8 11/07/2014 0835   RBC 4.19* 11/07/2014 0835   HGB 13.6 11/07/2014 0835   HCT  40.7 11/07/2014 0835   PLT 192 11/07/2014 0835   MCV 97.1 11/07/2014 0835   MCH 32.5 11/07/2014 0835   MCHC 33.4 11/07/2014 0835   RDW 12.7 11/07/2014 0835                          BMET    Component Value Date/Time   NA 137 11/07/2014 0835   K 4.9 11/07/2014 0835   CL 100 11/07/2014 0835   CO2 30 11/07/2014 0835   GLUCOSE 101* 11/07/2014 0835   BUN 17 11/07/2014 0835   CREATININE 1.02 11/07/2014 0835   CALCIUM 8.8 11/07/2014 0835   GFRNONAA 75* 11/07/2014 0835   GFRAA 87* 11/07/2014 0835     Assessment/Plan: Patient with neck pain and cervical radicular pain, numbness, tingling. 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 He is admitted for removal of the anterior cervical plate from Y6-5, revision of his C5-6 arthrodesis, and a C4-5 anterior cervical decompression and arthrodesis, with bone graft and anterior cervical plating. I've discussed with the patient the nature of his condition, the nature the surgical procedure, the typical length of surgery, hospital stay, and overall recuperation. We discussed limitations postoperatively. I discussed risks of surgery including risks of infection, bleeding, possibly need for transfusion, the risk of nerve root dysfunction with pain, weakness, numbness, or paresthesias, the risk of spinal cord dysfunction with paralysis of all 4 limbs and quadriplegia, and the risk of dural tear and CSF leakage and possible need for further surgery, the risk of esophageal dysfunction causing dysphagia and the risk of laryngeal dysfunction causing hoarseness of the voice, the risk of failure of the arthrodesis and the possible need for further surgery, and the risk of anesthetic complications including myocardial infarction, stroke, pneumonia, and death. We also discussed the need for postoperative immobilization in a cervical  collar. Understanding all this the patient does wish to proceed with surgery and is admitted for such.    Hosie Spangle, MD 11/20/2014 7:14 AM

## 2014-11-21 MED ORDER — HYDROCODONE-ACETAMINOPHEN 5-325 MG PO TABS: 1.0000 | ORAL_TABLET | ORAL | Status: DC | PRN

## 2014-11-21 NOTE — Discharge Summary (Signed)
Physician Discharge Summary  Patient ID: Steve Perez MRN: [id number] DOB/AGE: 01/27/1951 64 y.o.  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 Postoperatively he has done well. He is up and ambulate actively. His incision is healing nicely, but there is some mild bruising in the adjacent soft tissues. However there is no significant swelling. Patient is eating well, voiding well, and notes significant improvement already in both his neck pain and radicular symptoms. He is being discharged home with instructions regarding wound care activities. He is to return for follow-up in 3 weeks.  Discharge Exam: Blood pressure 142/73, pulse 68, temperature 98.9 F (37.2 C), temperature source Oral, resp. rate 18, SpO2 96 %.  Disposition: Home     Medication List    TAKE these medications        aspirin EC 81 MG tablet  Take 81 mg by mouth daily.     atorvastatin 20 MG tablet  Commonly known as:  LIPITOR  Take 20 mg by mouth daily.     CIALIS 20 MG tablet  Generic drug:  tadalafil  Take 20 mg by mouth daily as needed for erectile dysfunction.     FISH OIL PO  Take 1 capsule by mouth daily.     HYDROcodone-acetaminophen 5-325 MG per tablet  Commonly known as:  NORCO/VICODIN  Take 1-2 tablets by mouth every 4  (four) hours as needed for moderate pain.     lisinopril 20 MG tablet  Commonly known as:  PRINIVIL,ZESTRIL  Take 20 mg by mouth daily.     meloxicam 15 MG tablet  Commonly known as:  MOBIC  Take 15 mg by mouth daily.     montelukast 10 MG tablet  Commonly known as:  SINGULAIR  Take 10 mg by mouth daily.     MULTIVITAMIN PO  Take 1 tablet by mouth daily.     PROVENTIL HFA 108 (90 BASE) MCG/ACT inhaler  Generic drug:  albuterol  Inhale 2 puffs into the lungs every 6 (six) hours as needed for wheezing or shortness of breath.     QVAR 80 MCG/ACT inhaler  Generic drug:  beclomethasone  Inhale 1 puff into the lungs daily as needed (shortness of breath).     sertraline 100 MG tablet  Commonly known as:  ZOLOFT  Take 150 mg by mouth daily.     SUPER B COMPLEX PO  Take 1 tablet by mouth daily.     VITAMIN C PO  Take 1 tablet by mouth daily.     VOLTAREN 1 % Gel  Generic drug:  diclofenac sodium  Apply 2 g topically 3 (three) times daily as needed (pain).         Signed: Hosie Spangle, MD 11/21/2014, 8:01 AM

## 2014-11-21 NOTE — Progress Notes (Signed)
UR completed 

## 2014-11-21 NOTE — Progress Notes (Signed)
Patient alert and oriented, mae's well, voiding adequate amount of urine, swallowing without difficulty, no c/o pain. Patient discharged home with family. Script and discharged instructions given to patient. Patient and family stated understanding of d/c instructions given and has an appointment with MD. Aisha Jaskarn Schweer RN 

## 2014-11-21 NOTE — Discharge Instructions (Signed)

## 2014-11-24 ENCOUNTER — Encounter (HOSPITAL_COMMUNITY): Payer: Self-pay | Admitting: Neurosurgery

## 2015-06-30 ENCOUNTER — Other Ambulatory Visit: Payer: Self-pay | Admitting: Neurosurgery

## 2015-06-30 DIAGNOSIS — Z0000 Encounter for general adult medical examination without abnormal findings: Secondary | ICD-10-CM | POA: Diagnosis not present

## 2015-06-30 DIAGNOSIS — I10 Essential (primary) hypertension: Secondary | ICD-10-CM | POA: Diagnosis not present

## 2015-06-30 DIAGNOSIS — Z981 Arthrodesis status: Secondary | ICD-10-CM | POA: Diagnosis not present

## 2015-06-30 DIAGNOSIS — S129XXA Fracture of neck, unspecified, initial encounter: Secondary | ICD-10-CM | POA: Diagnosis not present

## 2015-06-30 DIAGNOSIS — R7301 Impaired fasting glucose: Secondary | ICD-10-CM | POA: Diagnosis not present

## 2015-07-07 DIAGNOSIS — R69 Illness, unspecified: Secondary | ICD-10-CM | POA: Diagnosis not present

## 2015-07-07 DIAGNOSIS — R7301 Impaired fasting glucose: Secondary | ICD-10-CM | POA: Diagnosis not present

## 2015-07-07 DIAGNOSIS — F17200 Nicotine dependence, unspecified, uncomplicated: Secondary | ICD-10-CM | POA: Diagnosis not present

## 2015-07-07 DIAGNOSIS — Z1389 Encounter for screening for other disorder: Secondary | ICD-10-CM | POA: Diagnosis not present

## 2015-07-07 DIAGNOSIS — Z86010 Personal history of colonic polyps: Secondary | ICD-10-CM | POA: Diagnosis not present

## 2015-07-07 DIAGNOSIS — M542 Cervicalgia: Secondary | ICD-10-CM | POA: Diagnosis not present

## 2015-07-07 DIAGNOSIS — I10 Essential (primary) hypertension: Secondary | ICD-10-CM | POA: Diagnosis not present

## 2015-07-07 DIAGNOSIS — J45909 Unspecified asthma, uncomplicated: Secondary | ICD-10-CM | POA: Diagnosis not present

## 2015-07-07 DIAGNOSIS — Z0000 Encounter for general adult medical examination without abnormal findings: Secondary | ICD-10-CM | POA: Diagnosis not present

## 2015-07-16 DIAGNOSIS — N433 Hydrocele, unspecified: Secondary | ICD-10-CM | POA: Diagnosis not present

## 2015-07-16 DIAGNOSIS — Z6828 Body mass index (BMI) 28.0-28.9, adult: Secondary | ICD-10-CM | POA: Diagnosis not present

## 2015-07-16 DIAGNOSIS — N509 Disorder of male genital organs, unspecified: Secondary | ICD-10-CM | POA: Diagnosis not present

## 2015-07-17 DIAGNOSIS — Z0100 Encounter for examination of eyes and vision without abnormal findings: Secondary | ICD-10-CM | POA: Diagnosis not present

## 2015-07-17 DIAGNOSIS — H5203 Hypermetropia, bilateral: Secondary | ICD-10-CM | POA: Diagnosis not present

## 2015-07-22 ENCOUNTER — Other Ambulatory Visit: Payer: Self-pay | Admitting: Internal Medicine

## 2015-07-22 DIAGNOSIS — I714 Abdominal aortic aneurysm, without rupture, unspecified: Secondary | ICD-10-CM

## 2015-07-22 DIAGNOSIS — Z23 Encounter for immunization: Secondary | ICD-10-CM | POA: Diagnosis not present

## 2015-07-23 ENCOUNTER — Ambulatory Visit
Admission: RE | Admit: 2015-07-23 | Discharge: 2015-07-23 | Disposition: A | Discharge status: Home / Self Care | Payer: Medicare HMO | Source: Ambulatory Visit | Attending: Internal Medicine | Admitting: Internal Medicine

## 2015-07-23 DIAGNOSIS — R1907 Generalized intra-abdominal and pelvic swelling, mass and lump: Secondary | ICD-10-CM | POA: Diagnosis not present

## 2015-07-23 DIAGNOSIS — I714 Abdominal aortic aneurysm, without rupture, unspecified: Secondary | ICD-10-CM

## 2015-07-27 ENCOUNTER — Encounter (HOSPITAL_COMMUNITY)
Admission: RE | Admit: 2015-07-27 | Discharge: 2015-07-27 | Disposition: A | Discharge status: Home / Self Care | Payer: Medicare HMO | Source: Ambulatory Visit | Attending: Neurosurgery | Admitting: Neurosurgery

## 2015-07-27 ENCOUNTER — Encounter (HOSPITAL_COMMUNITY): Payer: Self-pay

## 2015-07-27 DIAGNOSIS — Z01812 Encounter for preprocedural laboratory examination: Secondary | ICD-10-CM | POA: Diagnosis not present

## 2015-07-27 HISTORY — DX: Unspecified osteoarthritis, unspecified site: M19.90

## 2015-07-27 HISTORY — DX: Personal history of other diseases of the respiratory system: Z87.09

## 2015-07-27 LAB — BASIC METABOLIC PANEL
Anion gap: 12 (ref 5–15)
BUN: 17 mg/dL (ref 6–20)
CALCIUM: 9.3 mg/dL (ref 8.9–10.3)
CO2: 23 mmol/L (ref 22–32)
Chloride: 105 mmol/L (ref 101–111)
Creatinine, Ser: 0.82 mg/dL (ref 0.61–1.24)
GFR calc Af Amer: >60 mL/min (ref >60)
GLUCOSE: 106 mg/dL — AB (ref 65–99)
Potassium: 4.5 mmol/L (ref 3.5–5.1)
Sodium: 140 mmol/L (ref 135–145)

## 2015-07-27 LAB — CBC
HEMATOCRIT: 39.5 % (ref 39.0–52.0)
Hemoglobin: 13.3 g/dL (ref 13.0–17.0)
MCH: 32.7 pg (ref 26.0–34.0)
MCHC: 33.7 g/dL (ref 30.0–36.0)
MCV: 97.1 fL (ref 78.0–100.0)
PLATELETS: 175 10*3/uL (ref 150–400)
RBC: 4.07 MIL/uL — ABNORMAL LOW (ref 4.22–5.81)
RDW: 12.6 % (ref 11.5–15.5)
WBC: 6 10*3/uL (ref 4.0–10.5)

## 2015-07-27 LAB — SURGICAL PCR SCREEN
MRSA, PCR: NEGATIVE
Staphylococcus aureus: NEGATIVE

## 2015-07-27 NOTE — Pre-Procedure Instructions (Signed)
    Steve Perez  07/27/2015      [facility] - [place], [place] - [address] AT [facility] [address] [place] [address] Phone: [phone] Fax: [phone]    Your procedure is scheduled on Monday, November 14th, 2016.  Report to [facility] Admitting at 11:00 A.M.  Call this number if you have problems the morning of surgery:  [phone]   Remember:  Do not eat food or drink liquids after midnight.   Take these medicines the morning of surgery with A SIP OF WATER: Inhalers if needed (please bring with you), Sertraline (Zoloft).   Stop taking: Aspirin, NSAIDS, Meloxicam (Mobic), Ibuprofen, Advil, Motrin, Aleve, Naproxen, fish oil, all herbal medications, and all vitamins.    Do not wear jewelry.  Do not wear lotions, powders, or colognes.  You may NOT wear deodorant.  Men may shave face and neck.  Do not bring valuables to the hospital.  [facility] is not responsible for any belongings or valuables.  Contacts, dentures or bridgework may not be worn into surgery.  Leave your suitcase in the car.  After surgery it may be brought to your room.  For patients admitted to the hospital, discharge time will be determined by your treatment team.  Patients discharged the day of surgery will not be allowed to drive home.   Special instructions:  See attached.   Please read over the following fact sheets that you were given. Pain Booklet, Coughing and Deep Breathing, MRSA Information and Surgical Site Infection Prevention

## 2015-07-27 NOTE — Progress Notes (Signed)
PCP - Dr. Marton Redwood Cardiologist - denies  EKG - 11/07/14 - Epic CXR - denies  Echo/Stress test/cardiac cath - denies  Patient denies shortness of breath and chest pain at PAT appointment.

## 2015-08-10 ENCOUNTER — Observation Stay (HOSPITAL_COMMUNITY)
Admission: RE | Admit: 2015-08-10 | Discharge: 2015-08-11 | Disposition: A | Discharge status: Home / Self Care | Payer: Medicare HMO | Source: Ambulatory Visit | Attending: Neurosurgery | Admitting: Neurosurgery

## 2015-08-10 ENCOUNTER — Ambulatory Visit (HOSPITAL_COMMUNITY): Payer: Medicare HMO

## 2015-08-10 ENCOUNTER — Encounter (HOSPITAL_COMMUNITY): Payer: Self-pay | Admitting: Certified Registered"

## 2015-08-10 ENCOUNTER — Ambulatory Visit (HOSPITAL_COMMUNITY): Payer: Medicare HMO | Admitting: Certified Registered"

## 2015-08-10 ENCOUNTER — Encounter (HOSPITAL_COMMUNITY)
Admission: RE | Disposition: A | Discharge status: Home / Self Care | Payer: Self-pay | Source: Ambulatory Visit | Attending: Neurosurgery

## 2015-08-10 DIAGNOSIS — M4322 Fusion of spine, cervical region: Secondary | ICD-10-CM | POA: Diagnosis not present

## 2015-08-10 DIAGNOSIS — M4326 Fusion of spine, lumbar region: Secondary | ICD-10-CM | POA: Diagnosis not present

## 2015-08-10 DIAGNOSIS — Z7982 Long term (current) use of aspirin: Secondary | ICD-10-CM | POA: Diagnosis not present

## 2015-08-10 DIAGNOSIS — J45909 Unspecified asthma, uncomplicated: Secondary | ICD-10-CM | POA: Diagnosis not present

## 2015-08-10 DIAGNOSIS — F17290 Nicotine dependence, other tobacco product, uncomplicated: Secondary | ICD-10-CM | POA: Diagnosis not present

## 2015-08-10 DIAGNOSIS — Z419 Encounter for procedure for purposes other than remedying health state, unspecified: Secondary | ICD-10-CM

## 2015-08-10 DIAGNOSIS — Y838 Other surgical procedures as the cause of abnormal reaction of the patient, or of later complication, without mention of misadventure at the time of the procedure: Secondary | ICD-10-CM | POA: Diagnosis not present

## 2015-08-10 DIAGNOSIS — I10 Essential (primary) hypertension: Secondary | ICD-10-CM | POA: Diagnosis not present

## 2015-08-10 DIAGNOSIS — Z79899 Other long term (current) drug therapy: Secondary | ICD-10-CM | POA: Insufficient documentation

## 2015-08-10 DIAGNOSIS — S129XXA Fracture of neck, unspecified, initial encounter: Secondary | ICD-10-CM | POA: Diagnosis present

## 2015-08-10 DIAGNOSIS — M960 Pseudarthrosis after fusion or arthrodesis: Principal | ICD-10-CM | POA: Insufficient documentation

## 2015-08-10 DIAGNOSIS — R339 Retention of urine, unspecified: Secondary | ICD-10-CM | POA: Insufficient documentation

## 2015-08-10 DIAGNOSIS — R69 Illness, unspecified: Secondary | ICD-10-CM | POA: Diagnosis not present

## 2015-08-10 HISTORY — PX: POSTERIOR CERVICAL FUSION/FORAMINOTOMY: SHX5038

## 2015-08-10 SURGERY — POSTERIOR CERVICAL FUSION/FORAMINOTOMY LEVEL 2
Anesthesia: General | Site: Spine Cervical

## 2015-08-10 MED ORDER — CYCLOBENZAPRINE HCL 10 MG PO TABS
ORAL_TABLET | ORAL | Status: AC
  Filled 2015-08-10: qty 1

## 2015-08-10 MED ORDER — SODIUM CHLORIDE 0.9 % IR SOLN
Status: DC | PRN
  Administered 2015-08-10: 15:00:00

## 2015-08-10 MED ORDER — PROPOFOL 10 MG/ML IV BOLUS
INTRAVENOUS | Status: AC
  Filled 2015-08-10: qty 20

## 2015-08-10 MED ORDER — MIDAZOLAM HCL 5 MG/5ML IJ SOLN
INTRAMUSCULAR | Status: DC | PRN
  Administered 2015-08-10: 2 mg via INTRAVENOUS

## 2015-08-10 MED ORDER — ONDANSETRON HCL 4 MG PO TABS: 4.0000 mg | ORAL_TABLET | Freq: Four times a day (QID) | ORAL | Status: DC | PRN

## 2015-08-10 MED ORDER — BISACODYL 10 MG RE SUPP: 10.0000 mg | Freq: Every day | RECTAL | Status: DC | PRN

## 2015-08-10 MED ORDER — 0.9 % SODIUM CHLORIDE (POUR BTL) OPTIME
TOPICAL | Status: DC | PRN
  Administered 2015-08-10: 1000 mL

## 2015-08-10 MED ORDER — CEFAZOLIN SODIUM-DEXTROSE 2-3 GM-% IV SOLR
2.0000 g | INTRAVENOUS | Status: AC
  Administered 2015-08-10: 2 g via INTRAVENOUS
  Filled 2015-08-10: qty 50

## 2015-08-10 MED ORDER — OXYCODONE-ACETAMINOPHEN 5-325 MG PO TABS
ORAL_TABLET | ORAL | Status: AC
Start: 2015-08-10 — End: 2015-08-11
  Filled 2015-08-10: qty 2

## 2015-08-10 MED ORDER — HYDROXYZINE HCL 25 MG PO TABS: 50.0000 mg | ORAL_TABLET | ORAL | Status: DC | PRN

## 2015-08-10 MED ORDER — ALBUTEROL SULFATE HFA 108 (90 BASE) MCG/ACT IN AERS: 2.0000 | INHALATION_SPRAY | Freq: Four times a day (QID) | RESPIRATORY_TRACT | Status: DC | PRN

## 2015-08-10 MED ORDER — NEOSTIGMINE METHYLSULFATE 10 MG/10ML IV SOLN
INTRAVENOUS | Status: DC | PRN
  Administered 2015-08-10: 5 mg via INTRAVENOUS

## 2015-08-10 MED ORDER — MORPHINE SULFATE (PF) 4 MG/ML IV SOLN
4.0000 mg | INTRAVENOUS | Status: DC | PRN
  Administered 2015-08-10: 4 mg via INTRAMUSCULAR
  Filled 2015-08-10: qty 1

## 2015-08-10 MED ORDER — HYDROCODONE-ACETAMINOPHEN 5-325 MG PO TABS: 1.0000 | ORAL_TABLET | ORAL | Status: DC | PRN

## 2015-08-10 MED ORDER — LIDOCAINE HCL (CARDIAC) 20 MG/ML IV SOLN
INTRAVENOUS | Status: AC
  Filled 2015-08-10: qty 5

## 2015-08-10 MED ORDER — ACETAMINOPHEN 10 MG/ML IV SOLN
INTRAVENOUS | Status: AC
  Filled 2015-08-10: qty 100

## 2015-08-10 MED ORDER — ROCURONIUM BROMIDE 100 MG/10ML IV SOLN
INTRAVENOUS | Status: DC | PRN
  Administered 2015-08-10: 50 mg via INTRAVENOUS
  Administered 2015-08-10: 10 mg via INTRAVENOUS

## 2015-08-10 MED ORDER — BACITRACIN ZINC 500 UNIT/GM EX OINT
TOPICAL_OINTMENT | CUTANEOUS | Status: DC | PRN
  Administered 2015-08-10: 1 via TOPICAL

## 2015-08-10 MED ORDER — KETOROLAC TROMETHAMINE 30 MG/ML IJ SOLN
30.0000 mg | Freq: Once | INTRAMUSCULAR | Status: AC
  Administered 2015-08-10: 30 mg via INTRAVENOUS

## 2015-08-10 MED ORDER — HYDROXYZINE HCL 50 MG/ML IM SOLN
50.0000 mg | INTRAMUSCULAR | Status: DC | PRN
  Filled 2015-08-10: qty 1

## 2015-08-10 MED ORDER — MIDAZOLAM HCL 2 MG/2ML IJ SOLN
INTRAMUSCULAR | Status: AC
  Filled 2015-08-10: qty 2

## 2015-08-10 MED ORDER — SODIUM CHLORIDE 0.9 % IJ SOLN
3.0000 mL | Freq: Two times a day (BID) | INTRAMUSCULAR | Status: DC
  Administered 2015-08-10: 3 mL via INTRAVENOUS

## 2015-08-10 MED ORDER — DEXAMETHASONE SODIUM PHOSPHATE 4 MG/ML IJ SOLN
INTRAMUSCULAR | Status: DC | PRN
  Administered 2015-08-10: 4 mg via INTRAVENOUS

## 2015-08-10 MED ORDER — KETOROLAC TROMETHAMINE 30 MG/ML IJ SOLN
30.0000 mg | Freq: Four times a day (QID) | INTRAMUSCULAR | Status: DC
  Administered 2015-08-10 – 2015-08-11 (×2): 30 mg via INTRAVENOUS
  Filled 2015-08-10 (×2): qty 1

## 2015-08-10 MED ORDER — LIDOCAINE HCL (CARDIAC) 20 MG/ML IV SOLN
INTRAVENOUS | Status: DC | PRN
  Administered 2015-08-10: 40 mg via INTRAVENOUS

## 2015-08-10 MED ORDER — SODIUM CHLORIDE 0.9 % IV SOLN: 250.0000 mL | INTRAVENOUS | Status: DC

## 2015-08-10 MED ORDER — DEXTROSE 5 % IV SOLN
10.0000 mg | INTRAVENOUS | Status: DC | PRN
  Administered 2015-08-10: 25 ug/min via INTRAVENOUS

## 2015-08-10 MED ORDER — THROMBIN 20000 UNITS EX SOLR
CUTANEOUS | Status: DC | PRN
  Administered 2015-08-10: 16:00:00 via TOPICAL

## 2015-08-10 MED ORDER — VITAMIN D3 25 MCG (1000 UNIT) PO TABS
1000.0000 [IU] | ORAL_TABLET | Freq: Every day | ORAL | Status: DC
  Administered 2015-08-11: 1000 [IU] via ORAL
  Filled 2015-08-10 (×2): qty 1

## 2015-08-10 MED ORDER — SODIUM CHLORIDE 0.9 % IJ SOLN: 3.0000 mL | INTRAMUSCULAR | Status: DC | PRN

## 2015-08-10 MED ORDER — OXYCODONE-ACETAMINOPHEN 5-325 MG PO TABS
1.0000 | ORAL_TABLET | ORAL | Status: DC | PRN
  Administered 2015-08-10 – 2015-08-11 (×3): 2 via ORAL
  Filled 2015-08-10 (×2): qty 2

## 2015-08-10 MED ORDER — ROCURONIUM BROMIDE 50 MG/5ML IV SOLN
INTRAVENOUS | Status: AC
  Filled 2015-08-10: qty 1

## 2015-08-10 MED ORDER — PHENOL 1.4 % MT LIQD: 1.0000 | OROMUCOSAL | Status: DC | PRN

## 2015-08-10 MED ORDER — DEXTROSE-NACL 5-0.45 % IV SOLN: INTRAVENOUS | Status: DC

## 2015-08-10 MED ORDER — FENTANYL CITRATE (PF) 250 MCG/5ML IJ SOLN
INTRAMUSCULAR | Status: AC
  Filled 2015-08-10: qty 5

## 2015-08-10 MED ORDER — MENTHOL 3 MG MT LOZG: 1.0000 | LOZENGE | OROMUCOSAL | Status: DC | PRN

## 2015-08-10 MED ORDER — HYDROMORPHONE HCL 1 MG/ML IJ SOLN
INTRAMUSCULAR | Status: AC
  Filled 2015-08-10: qty 1

## 2015-08-10 MED ORDER — LACTATED RINGERS IV SOLN
INTRAVENOUS | Status: DC
  Administered 2015-08-10 (×2): via INTRAVENOUS

## 2015-08-10 MED ORDER — ALUM & MAG HYDROXIDE-SIMETH 200-200-20 MG/5ML PO SUSP: 30.0000 mL | Freq: Four times a day (QID) | ORAL | Status: DC | PRN

## 2015-08-10 MED ORDER — SERTRALINE HCL 50 MG PO TABS
150.0000 mg | ORAL_TABLET | Freq: Every day | ORAL | Status: DC
  Administered 2015-08-11: 150 mg via ORAL
  Filled 2015-08-10: qty 1

## 2015-08-10 MED ORDER — ALBUTEROL SULFATE (2.5 MG/3ML) 0.083% IN NEBU: 2.5000 mg | INHALATION_SOLUTION | Freq: Four times a day (QID) | RESPIRATORY_TRACT | Status: DC | PRN

## 2015-08-10 MED ORDER — ACETAMINOPHEN 650 MG RE SUPP: 650.0000 mg | RECTAL | Status: DC | PRN

## 2015-08-10 MED ORDER — ONDANSETRON HCL 4 MG/2ML IJ SOLN
INTRAMUSCULAR | Status: DC | PRN
  Administered 2015-08-10 (×2): 4 mg via INTRAVENOUS

## 2015-08-10 MED ORDER — LISINOPRIL 20 MG PO TABS
20.0000 mg | ORAL_TABLET | Freq: Every day | ORAL | Status: DC
  Administered 2015-08-10 – 2015-08-11 (×2): 20 mg via ORAL
  Filled 2015-08-10 (×2): qty 1

## 2015-08-10 MED ORDER — FENTANYL CITRATE (PF) 100 MCG/2ML IJ SOLN
INTRAMUSCULAR | Status: DC | PRN
  Administered 2015-08-10 (×4): 50 ug via INTRAVENOUS
  Administered 2015-08-10: 100 ug via INTRAVENOUS
  Administered 2015-08-10 (×3): 50 ug via INTRAVENOUS

## 2015-08-10 MED ORDER — HYDROMORPHONE HCL 1 MG/ML IJ SOLN
0.2500 mg | INTRAMUSCULAR | Status: DC | PRN
  Administered 2015-08-10 (×2): 0.5 mg via INTRAVENOUS

## 2015-08-10 MED ORDER — MAGNESIUM HYDROXIDE 400 MG/5ML PO SUSP
30.0000 mL | Freq: Every day | ORAL | Status: DC | PRN
Start: 2015-08-10 — End: 2015-08-11

## 2015-08-10 MED ORDER — ACETAMINOPHEN 325 MG PO TABS: 650.0000 mg | ORAL_TABLET | ORAL | Status: DC | PRN

## 2015-08-10 MED ORDER — MIDAZOLAM HCL 2 MG/2ML IJ SOLN
INTRAMUSCULAR | Status: AC
  Filled 2015-08-10: qty 4

## 2015-08-10 MED ORDER — ATORVASTATIN CALCIUM 20 MG PO TABS
20.0000 mg | ORAL_TABLET | Freq: Every day | ORAL | Status: DC
  Administered 2015-08-10: 20 mg via ORAL
  Filled 2015-08-10: qty 1

## 2015-08-10 MED ORDER — CYCLOBENZAPRINE HCL 10 MG PO TABS
10.0000 mg | ORAL_TABLET | Freq: Three times a day (TID) | ORAL | Status: DC | PRN
  Administered 2015-08-10 – 2015-08-11 (×2): 10 mg via ORAL
  Filled 2015-08-10: qty 1

## 2015-08-10 MED ORDER — LIDOCAINE HCL 4 % MT SOLN
OROMUCOSAL | Status: DC | PRN
  Administered 2015-08-10: 4 mL via TOPICAL

## 2015-08-10 MED ORDER — EPHEDRINE SULFATE 50 MG/ML IJ SOLN
INTRAMUSCULAR | Status: DC | PRN
  Administered 2015-08-10 (×4): 5 mg via INTRAVENOUS

## 2015-08-10 MED ORDER — LIDOCAINE-EPINEPHRINE 1 %-1:100000 IJ SOLN
INTRAMUSCULAR | Status: DC | PRN
  Administered 2015-08-10: 8.5 mL

## 2015-08-10 MED ORDER — BUPIVACAINE HCL (PF) 0.5 % IJ SOLN
INTRAMUSCULAR | Status: DC | PRN
  Administered 2015-08-10: 8.5 mL

## 2015-08-10 MED ORDER — ACETAMINOPHEN 10 MG/ML IV SOLN
INTRAVENOUS | Status: DC | PRN
  Administered 2015-08-10: 1000 mg via INTRAVENOUS

## 2015-08-10 MED ORDER — KETOROLAC TROMETHAMINE 30 MG/ML IJ SOLN
INTRAMUSCULAR | Status: AC
  Filled 2015-08-10: qty 1

## 2015-08-10 MED ORDER — PHENYLEPHRINE HCL 10 MG/ML IJ SOLN
INTRAMUSCULAR | Status: DC | PRN
  Administered 2015-08-10: 80 ug via INTRAVENOUS

## 2015-08-10 MED ORDER — PROPOFOL 10 MG/ML IV BOLUS
INTRAVENOUS | Status: DC | PRN
  Administered 2015-08-10: 30 mg via INTRAVENOUS
  Administered 2015-08-10: 170 mg via INTRAVENOUS
  Administered 2015-08-10: 30 mg via INTRAVENOUS

## 2015-08-10 MED ORDER — GLYCOPYRROLATE 0.2 MG/ML IJ SOLN
INTRAMUSCULAR | Status: DC | PRN
  Administered 2015-08-10: .8 mg via INTRAVENOUS
  Administered 2015-08-10: .1 mg via INTRAVENOUS

## 2015-08-10 MED ORDER — ONDANSETRON HCL 4 MG/2ML IJ SOLN: 4.0000 mg | Freq: Four times a day (QID) | INTRAMUSCULAR | Status: DC | PRN

## 2015-08-10 SURGICAL SUPPLY — 70 items
ADH SKN CLS APL DERMABOND .7 (GAUZE/BANDAGES/DRESSINGS) ×2
BAG DECANTER FOR FLEXI CONT (MISCELLANEOUS) ×2 IMPLANT
BIT DRILL NEURO 2X3.1 SFT TUCH (MISCELLANEOUS) ×1 IMPLANT
BIT DRILL WIRE PASS 1.3MM (BIT) ×1 IMPLANT
BLADE CLIPPER SURG (BLADE) ×2 IMPLANT
BLOCKER OASYS (Neuro Prosthesis/Implant) ×6 IMPLANT
BRUSH SCRUB EZ PLAIN DRY (MISCELLANEOUS) ×2 IMPLANT
CANISTER SUCT 3000ML PPV (MISCELLANEOUS) ×2 IMPLANT
COVER BACK TABLE 60X90IN (DRAPES) ×1 IMPLANT
DECANTER SPIKE VIAL GLASS SM (MISCELLANEOUS) ×2 IMPLANT
DERMABOND ADVANCED (GAUZE/BANDAGES/DRESSINGS) ×2
DERMABOND ADVANCED .7 DNX12 (GAUZE/BANDAGES/DRESSINGS) ×1 IMPLANT
DRAPE C-ARM 42X72 X-RAY (DRAPES) ×4 IMPLANT
DRAPE LAPAROTOMY 100X72 PEDS (DRAPES) ×2 IMPLANT
DRAPE POUCH INSTRU U-SHP 10X18 (DRAPES) ×2 IMPLANT
DRAPE PROXIMA HALF (DRAPES) IMPLANT
DRILL NEURO 2X3.1 SOFT TOUCH (MISCELLANEOUS) ×2
DRILL OASYS 2.5MM (BIT) IMPLANT
DRILL WIRE PASS 1.3MM (BIT) ×2
DRIUS OASYS 2.5MM (BIT) ×2
DRSG EMULSION OIL 3X3 NADH (GAUZE/BANDAGES/DRESSINGS) IMPLANT
ELECT REM PT RETURN 9FT ADLT (ELECTROSURGICAL) ×2
ELECTRODE REM PT RTRN 9FT ADLT (ELECTROSURGICAL) ×1 IMPLANT
EVACUATOR 1/8 PVC DRAIN (DRAIN) IMPLANT
GAUZE SPONGE 4X4 12PLY STRL (GAUZE/BANDAGES/DRESSINGS) ×1 IMPLANT
GAUZE SPONGE 4X4 16PLY XRAY LF (GAUZE/BANDAGES/DRESSINGS) IMPLANT
GLOVE BIO SURGEON STRL SZ 6.5 (GLOVE) ×4 IMPLANT
GLOVE BIOGEL PI IND STRL 6.5 (GLOVE) IMPLANT
GLOVE BIOGEL PI IND STRL 8 (GLOVE) ×1 IMPLANT
GLOVE BIOGEL PI INDICATOR 6.5 (GLOVE) ×3
GLOVE BIOGEL PI INDICATOR 8 (GLOVE) ×1
GLOVE ECLIPSE 7.5 STRL STRAW (GLOVE) ×2 IMPLANT
GLOVE EXAM NITRILE LRG STRL (GLOVE) IMPLANT
GLOVE EXAM NITRILE MD LF STRL (GLOVE) IMPLANT
GLOVE EXAM NITRILE XL STR (GLOVE) IMPLANT
GLOVE EXAM NITRILE XS STR PU (GLOVE) IMPLANT
GOWN STRL REUS W/ TWL LRG LVL3 (GOWN DISPOSABLE) IMPLANT
GOWN STRL REUS W/ TWL XL LVL3 (GOWN DISPOSABLE) ×1 IMPLANT
GOWN STRL REUS W/TWL 2XL LVL3 (GOWN DISPOSABLE) IMPLANT
GOWN STRL REUS W/TWL LRG LVL3 (GOWN DISPOSABLE) ×4
GOWN STRL REUS W/TWL XL LVL3 (GOWN DISPOSABLE) ×2
HEMOSTAT SURGICEL 2X14 (HEMOSTASIS) IMPLANT
KIT BASIN OR (CUSTOM PROCEDURE TRAY) ×2 IMPLANT
KIT INFUSE XX SMALL 0.7CC (Orthopedic Implant) ×1 IMPLANT
KIT ROOM TURNOVER OR (KITS) ×2 IMPLANT
NDL SPNL 18GX3.5 QUINCKE PK (NEEDLE) ×1 IMPLANT
NDL SPNL 22GX3.5 QUINCKE BK (NEEDLE) ×2 IMPLANT
NEEDLE SPNL 18GX3.5 QUINCKE PK (NEEDLE) ×2 IMPLANT
NEEDLE SPNL 22GX3.5 QUINCKE BK (NEEDLE) ×4 IMPLANT
NS IRRIG 1000ML POUR BTL (IV SOLUTION) ×2 IMPLANT
PACK LAMINECTOMY NEURO (CUSTOM PROCEDURE TRAY) ×2 IMPLANT
PAD ARMBOARD 7.5X6 YLW CONV (MISCELLANEOUS) ×8 IMPLANT
PIN MAYFIELD SKULL DISP (PIN) ×2 IMPLANT
ROD OASYS 3.5X40MM (Rod) ×2 IMPLANT
SCREW BIASED ANGLE 3.5X14 (Screw) ×6 IMPLANT
SPONGE LAP 4X18 X RAY DECT (DISPOSABLE) IMPLANT
SPONGE SURGIFOAM ABS GEL 100 (HEMOSTASIS) ×2 IMPLANT
STAPLER SKIN PROX WIDE 3.9 (STAPLE) ×2 IMPLANT
STRIP BIOACTIVE VITOSS 25X52X4 (Orthopedic Implant) ×1 IMPLANT
SUT ETHILON 3 0 FSL (SUTURE) IMPLANT
SUT VIC AB 0 CT1 18XCR BRD8 (SUTURE) ×1 IMPLANT
SUT VIC AB 0 CT1 8-18 (SUTURE) ×2
SUT VIC AB 2-0 CP2 18 (SUTURE) ×2 IMPLANT
TAP 3.5MM (TAP) ×1 IMPLANT
TAPE CLOTH SURG 4X10 WHT LF (GAUZE/BANDAGES/DRESSINGS) ×1 IMPLANT
TOWEL OR 17X24 6PK STRL BLUE (TOWEL DISPOSABLE) ×2 IMPLANT
TOWEL OR 17X26 10 PK STRL BLUE (TOWEL DISPOSABLE) ×2 IMPLANT
TRAY FOLEY W/METER SILVER 14FR (SET/KITS/TRAYS/PACK) IMPLANT
UNDERPAD 30X30 INCONTINENT (UNDERPADS AND DIAPERS) ×1 IMPLANT
WATER STERILE IRR 1000ML POUR (IV SOLUTION) ×2 IMPLANT

## 2015-08-10 NOTE — Progress Notes (Signed)
Filed Vitals:   08/10/15 1641 08/10/15 1649 08/10/15 1651 08/10/15 1711  BP: 110/69   110/58  Pulse: 62 66 71 55  Temp:   97.9 F (36.6 C) 98.3 F (36.8 C)  TempSrc:      Resp: 19 30 18 18   Height:      Weight:      SpO2: 94% 96% 96% 96%    Patient doing well following surgery. Up and ambulating actively. Eating dinner. Dressing clean and dry. Has not yet voided, nursing staff to monitor voiding function. Moving all 4 extremities well.  Plan:  Continue to progress through postoperative recovery. Encouraged to ambulate.  Hosie Spangle, MD 08/10/2015, 7:07 PM

## 2015-08-10 NOTE — Anesthesia Preprocedure Evaluation (Addendum)
Anesthesia Evaluation  Patient identified by MRN, date of birth, ID band Patient awake    Reviewed: Allergy & Precautions, H&P , NPO status , Patient's Chart, lab work & pertinent test results  Airway Mallampati: II  TM Distance: >3 FB Neck ROM: Full    Dental no notable dental hx. (+) Teeth Intact, Dental Advisory Given   Pulmonary asthma , Current Smoker,    Pulmonary exam normal breath sounds clear to auscultation       Cardiovascular hypertension, Pt. on medications  Rhythm:Regular Rate:Normal     Neuro/Psych Anxiety negative neurological ROS  negative psych ROS   GI/Hepatic negative GI ROS, Neg liver ROS,   Endo/Other  negative endocrine ROS  Renal/GU negative Renal ROS  negative genitourinary   Musculoskeletal  (+) Arthritis , Osteoarthritis,    Abdominal   Peds  Hematology negative hematology ROS (+)   Anesthesia Other Findings   Reproductive/Obstetrics negative OB ROS                            Anesthesia Physical Anesthesia Plan  ASA: II  Anesthesia Plan: General   Post-op Pain Management:    Induction: Intravenous  Airway Management Planned: Oral ETT  Additional Equipment:   Intra-op Plan:   Post-operative Plan: Extubation in OR  Informed Consent: I have reviewed the patients History and Physical, chart, labs and discussed the procedure including the risks, benefits and alternatives for the proposed anesthesia with the patient or authorized representative who has indicated his/her understanding and acceptance.   Dental advisory given  Plan Discussed with: CRNA  Anesthesia Plan Comments:         Anesthesia Quick Evaluation

## 2015-08-10 NOTE — Anesthesia Procedure Notes (Signed)
Procedure Name: Intubation Date/Time: 08/10/2015 2:54 PM Performed by: Merdis Delay Pre-anesthesia Checklist: Patient identified, Emergency Drugs available, Suction available, Patient being monitored and Timeout performed Patient Re-evaluated:Patient Re-evaluated prior to inductionOxygen Delivery Method: Circle system utilized Preoxygenation: Pre-oxygenation with 100% oxygen Intubation Type: IV induction Ventilation: Mask ventilation without difficulty and Oral airway inserted - appropriate to patient size Laryngoscope Size: Mac and 4 Grade View: Grade II Tube type: Oral Tube size: 7.5 mm Number of attempts: 1 Airway Equipment and Method: Stylet and LTA kit utilized Placement Confirmation: breath sounds checked- equal and bilateral,  ETT inserted through vocal cords under direct vision,  CO2 detector and positive ETCO2 Secured at: 23 cm Tube secured with: Tape Dental Injury: Teeth and Oropharynx as per pre-operative assessment

## 2015-08-10 NOTE — H&P (Signed)
Subjective: Patient is a 64 y.o. right-handed white male who is admitted for treatment of nonunion and pseudoarthrosis at the C4-5 and C5-6 levels.  Patient is status post a C5-6 and C6-7 ACDF in December 2007. He developed nonunion and pseudoarthrosis at C5-6, and subsequently developed progressive degeneration at C4-5 and underwent a 2 level C4-5 ACDF and revision of his C5-6 ACDF in February 2016. Recent x-ray shows fracture of both screws at C6, with nonunion and pseudoarthrosis at C5-6 and probably at C4-5. He is therefore admitted for a C4-C6 posterior cervical arthrodesis with lateral mass screws, rods, and bone graft. His greatest risk factor regarding his nonunion and pseudoarthrosis it discontinued cigar smoking habit, and he has been counseled to try and quit smoking.   Patient Active Problem List   Diagnosis Date Noted  . HNP (herniated nucleus pulposus), cervical 11/20/2014   Past Medical History  Diagnosis Date  . Hypertension   . Anxiety     panic attacks  . Arthritis   . Asthma   . History of bronchitis   . Joint inflammation     thumb, bilaterally    Past Surgical History  Procedure Laterality Date  . Cervical fusion    . Fracture surgery Right     right ankle  . Arthroscopic knee Left   . Tonsillectomy    . Anterior cervical decomp/discectomy fusion N/A 11/20/2014    Procedure: ANTERIOR CERVICAL DECOMPRESSION/DISCECTOMY FUSION CERVICAL FOUR-FIVE,CERVICAL FIVE-SIX WITH/HARDWARE REMOVAL  AT CERVICAL FIVE-SIX,CERVICAL SIX-SEVEN.;  Surgeon: Hosie Spangle, MD;  Location: [facility];  Service: Neurosurgery;  Laterality: N/A;  . Colonoscopy      Prescriptions prior to admission  Medication Sig Dispense Refill Last Dose  . Ascorbic Acid (VITAMIN C PO) Take 1 tablet by mouth daily.   Past Month at Unknown time  . aspirin EC 81 MG tablet Take 81 mg by mouth daily.   Past Month at Unknown time  . atorvastatin (LIPITOR) 20 MG tablet Take 20 mg by mouth daily.  2  08/09/2015 at Unknown time  . B Complex-C (SUPER B COMPLEX PO) Take 1 tablet by mouth daily.   Past Month at Unknown time  . cholecalciferol (VITAMIN D) 1000 UNITS tablet Take 1,000 Units by mouth daily.   Past Month at Unknown time  . lisinopril (PRINIVIL,ZESTRIL) 20 MG tablet Take 20 mg by mouth daily.  2 08/09/2015 at Unknown time  . meloxicam (MOBIC) 15 MG tablet Take 15 mg by mouth daily.  5 08/09/2015 at Unknown time  . Multiple Vitamins-Minerals (MULTIVITAMIN PO) Take 1 tablet by mouth daily.   08/09/2015 at Unknown time  . Omega-3 Fatty Acids (FISH OIL PO) Take 1 capsule by mouth daily.   Past Month at Unknown time  . PROVENTIL HFA 108 (90 BASE) MCG/ACT inhaler Inhale 2 puffs into the lungs every 6 (six) hours as needed for wheezing or shortness of breath.   11 08/09/2015 at Unknown time  . QVAR 80 MCG/ACT inhaler Inhale 1 puff into the lungs daily as needed (shortness of breath).   11 08/09/2015 at Unknown time  . sertraline (ZOLOFT) 100 MG tablet Take 150 mg by mouth daily.  2 08/10/2015 at 0630   No Known Allergies  Social History  Substance Use Topics  . Smoking status: Current Every Day Smoker    Types: Cigars  . Smokeless tobacco: Not on file     Comment: "couple cigars a day"  . Alcohol Use: Yes     Comment: socially  History reviewed. No pertinent family history.   Review of Systems A comprehensive review of systems was negative.  Objective: Vital signs in last 24 hours: Temp:  [97.8 F (36.6 C)] 97.8 F (36.6 C) (11/14 1117) Pulse Rate:  [58] 58 (11/14 1117) Resp:  [20] 20 (11/14 1117) BP: (128)/(73) 128/73 mmHg (11/14 1117) SpO2:  [96 %] 96 % (11/14 1117) Weight:  [97.523 kg (215 lb)] 97.523 kg (215 lb) (11/14 1142)  EXAM: Patient well-developed well-nourished white male in no acute distress. Lungs are clear to auscultation , the patient has symmetrical respiratory excursion. Heart has a regular rate and rhythm normal S1 and S2 no murmur.   Abdomen is soft  nontender nondistended bowel sounds are present. Extremity examination shows no clubbing cyanosis or edema. Motor examination shows 5 over 5 strength in the upper extremities including the deltoid biceps triceps and intrinsics and grip. Sensation is intact to pinprick throughout the digits of the upper extremities. Reflexes are symmetrical and without evidence of pathologic reflexes. Patient has a normal gait and stance.   Data Review:CBC    Component Value Date/Time   WBC 6.0 07/27/2015 0837   RBC 4.07* 07/27/2015 0837   HGB 13.3 07/27/2015 0837   HCT 39.5 07/27/2015 0837   PLT 175 07/27/2015 0837   MCV 97.1 07/27/2015 0837   MCH 32.7 07/27/2015 0837   MCHC 33.7 07/27/2015 0837   RDW 12.6 07/27/2015 0837                          BMET    Component Value Date/Time   NA 140 07/27/2015 0837   K 4.5 07/27/2015 0837   CL 105 07/27/2015 0837   CO2 23 07/27/2015 0837   GLUCOSE 106* 07/27/2015 0837   BUN 17 07/27/2015 0837   CREATININE 0.82 07/27/2015 0837   CALCIUM 9.3 07/27/2015 0837   GFRNONAA >60 07/27/2015 0837   GFRAA >60 07/27/2015 0837     Assessment/Plan: Patient with nonunion and pseudoarthrosis at the C4-5 and C5-6 levels, as described above, who is admitted now for a C4-C6 posterior cervical arthrodesis as described above.  I've discussed with the patient the nature of his condition, the nature the surgical procedure, the typical length of surgery, hospital stay, and overall recuperation. We discussed limitations postoperatively. I discussed risks of surgery including risks of infection, bleeding, possibly need for transfusion, the risk of nerve root dysfunction with pain, weakness, numbness, or paresthesias, the risk of spinal cord dysfunction with paralysis of all 4 limbs and quadriplegia, and the risk of dural tear and CSF leakage and possible need for further surgery, the risk of failure of the arthrodesis and the possible need for further surgery, and the risk of  anesthetic complications including myocardial infarction, stroke, pneumonia, and death. We also discussed the need for postoperative immobilization in a cervical collar. Understanding all this the patient does wish to proceed with surgery and is admitted for such.    Hosie Spangle, MD 08/10/2015 2:10 PM

## 2015-08-10 NOTE — Op Note (Signed)
08/10/2015  4:00 PM  PATIENT:  Steve Perez  64 y.o. 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  ANESTHESIA:   general  EBL:  Total I/O In: 1000 [I.V.:1000] Out: 50 [Blood:50]  BLOOD ADMINISTERED:none  COUNT:  Correct per nursing staff  DICTATION: Patient was brought to the operating room, placed under general endotracheal anesthesia.  Radiolucent 3 pin Mayfield head holderwas applied, and the patient was turned to a prone position. The posterior neck, occiput, and upper back were prepped with Betadine soap and solution and draped in a sterile fashion. The midline was infiltrated with local anesthetic with epinephrine.  The C-arm fluoroscope was used throughout the procedure to guide localization and screw placement. The C4-5 and C5-6 levels were identified.  A midline incision was made over the C4-C6  levels, and carried down through the subcutaneous tissue. Bipolar cautery and electrocautery used to maintain hemostasis.  Posterior cervical fascia was incised bilaterally, and the paracervical musculature was dissected from the spinous processes and lamina in a subperiosteal fashion. Self-retaining retractor was placed. Using the C-arm fluoroscope we identified the lateral masses of C4, C5, and C6. Pilot holes were made using the high-speed drill with the wire passer bur. Each screw hole was then drilled using the hand drill and a drill guide set to a depth of 14 mm. Each screw hole was examined with the ball probe. Good bony surfaces were noted. C-arm fluoroscopy confirmed good screw hole positioning, and the posterior cortex of each screw hole  was tapped, and a  3.5 x 14 mm screw placed. Once all 6  screws were placed, we decorticated the facet joint surfaces and laminar surfaces with the high-speed drill. 40  mm rods were lordosed and  placed within the screw heads on each side, and locking caps were placed. Once all 6 locking caps were placed, final tightening was performed against a counter torque. We then packed a combination of infuse and Vitoss BA into the C4-5 and C5-6 facet joints bilaterally, as well as over the C4, C5, and C6 laminar surfaces. We then proceeded with closure. Deep fascia was closed with interrupted undyed 0 Vicryl sutures. Scarpa's fascia was closed with interrupted undyed 0 Vicryl sutures. The subcutaneous and subcuticular closed with interrupted inverted 2-0 undyed Vicryl sutures.  Skin edges were closed with Dermabond. The wound was dressed with sterile gauze and Hypafix.  Following surgery the patient was turned back to a supine position, the 3 pin Mayfield head holder was removed, and the patient is to be reversed and the anesthetic, extubated, and transferred to the recovery room for further care.  PLAN OF CARE: Admit for overnight observation  PATIENT DISPOSITION:  PACU - hemodynamically stable.   Delay start of Pharmacological VTE agent (>24hrs) due to surgical blood loss or risk of bleeding:  yes

## 2015-08-10 NOTE — Transfer of Care (Signed)
Immediate Anesthesia Transfer of Care Note  Patient: Steve Perez  Procedure(s) Performed: Procedure(s) with comments: CERVICAL FOUR-FIVE, CERVICAL FIVE-SIX POSTERIOR CERVICAL FUSION/FORAMINOTOMY  (N/A) - C4-C6 posterior cervical fusion with instrumentation  Patient Location: PACU  Anesthesia Type:General  Level of Consciousness: awake, alert  and oriented  Airway & Oxygen Therapy: Patient Spontanous Breathing and Patient connected to nasal cannula oxygen  Post-op Assessment: Report given to RN and Post -op Vital signs reviewed and stable  Post vital signs: Reviewed and stable  Last Vitals:  Filed Vitals:   08/10/15 1612  BP: 118/81  Pulse: 79  Temp:   Resp: 25    Complications: No apparent anesthesia complications

## 2015-08-11 ENCOUNTER — Encounter (HOSPITAL_COMMUNITY): Payer: Self-pay | Admitting: Neurosurgery

## 2015-08-11 DIAGNOSIS — M960 Pseudarthrosis after fusion or arthrodesis: Secondary | ICD-10-CM | POA: Diagnosis not present

## 2015-08-11 MED ORDER — OXYCODONE-ACETAMINOPHEN 5-325 MG PO TABS: 1.0000 | ORAL_TABLET | ORAL | Status: DC | PRN

## 2015-08-11 MED ORDER — CYCLOBENZAPRINE HCL 10 MG PO TABS: 5.0000 mg | ORAL_TABLET | Freq: Three times a day (TID) | ORAL | Status: DC | PRN

## 2015-08-11 MED ORDER — TAMSULOSIN HCL 0.4 MG PO CAPS
0.4000 mg | ORAL_CAPSULE | Freq: Every day | ORAL | Status: DC
  Administered 2015-08-11: 0.4 mg via ORAL
  Filled 2015-08-11: qty 1

## 2015-08-11 NOTE — Discharge Instructions (Signed)

## 2015-08-11 NOTE — Progress Notes (Signed)
Pt doing well. Pt and wife given D/C instructions with Rx's, verbal understanding was provided. Pt's IV was removed prior to D/C. Dressing change instructions given to wife with verbal understanding. Pt voiding without difficulty, PVR less than 100. Pt D/C'd home via walking @ 1125 per MD order. Pt is stable @ D/C and has no other needs at this time. Holli Humbles, RN

## 2015-08-11 NOTE — Anesthesia Postprocedure Evaluation (Signed)
  Anesthesia Post-op Note  Patient: Steve Perez  Procedure(s) Performed: Procedure(s) with comments: CERVICAL FOUR-FIVE, CERVICAL FIVE-SIX POSTERIOR CERVICAL FUSION/FORAMINOTOMY  (N/A) - C4-C6 posterior cervical fusion with instrumentation  Patient Location: PACU  Anesthesia Type:General  Level of Consciousness: awake and alert   Airway and Oxygen Therapy: Patient Spontanous Breathing  Post-op Pain: Controlled  Post-op Assessment: Post-op Vital signs reviewed, Patient's Cardiovascular Status Stable and Respiratory Function Stable  Post-op Vital Signs: Reviewed  Filed Vitals:   08/11/15 0814  BP: 120/64  Pulse: 60  Temp: 36.9 C  Resp: 18    Complications: No apparent anesthesia complications

## 2015-08-11 NOTE — Discharge Summary (Signed)
Physician Discharge Summary  Patient ID: Steve Perez MRN: [id number] DOB/AGE: 12-24-1950 64 y.o.  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 He's done well following surgery however he's had some difficulty with urinary retention, however that is improving. He will be discharged once his PVR is less than 250 mL. Minimal amount of drainage from his wound. His wife will be instructed by the nursing staff in dressing changes which I like to continue daily and when necessary. He is to return for follow-up with me in about 3 weeks, he has a appointment scheduled. He has been given instructions regarding activities following discharge.  Discharge Exam: Blood pressure 106/57, pulse 57, temperature 98.2 F (36.8 C), temperature source Oral, resp. rate 18, height 6' (1.829 m), weight 97.523 kg (215 lb), SpO2 98 %.  Disposition: Home     Medication List    TAKE these medications        aspirin EC 81 MG tablet  Take 81 mg by mouth daily.     atorvastatin 20 MG tablet  Commonly known as:  LIPITOR  Take 20 mg by mouth daily.     cholecalciferol 1000 UNITS tablet  Commonly known as:  VITAMIN D  Take 1,000 Units by mouth daily.     cyclobenzaprine 10 MG tablet  Commonly known as:  FLEXERIL  Take 0.5-1 tablets (5-10 mg total) by mouth 3 (three) times daily as needed for muscle spasms.     FISH OIL PO  Take 1 capsule by mouth daily.     lisinopril 20 MG tablet  Commonly known as:  PRINIVIL,ZESTRIL  Take 20 mg by mouth daily.     meloxicam 15 MG tablet  Commonly known as:  MOBIC  Take 15 mg by mouth daily.     MULTIVITAMIN PO   Take 1 tablet by mouth daily.     oxyCODONE-acetaminophen 5-325 MG tablet  Commonly known as:  PERCOCET/ROXICET  Take 1-2 tablets by mouth every 4 (four) hours as needed for moderate pain.     PROVENTIL HFA 108 (90 BASE) MCG/ACT inhaler  Generic drug:  albuterol  Inhale 2 puffs into the lungs every 6 (six) hours as needed for wheezing or shortness of breath.     QVAR 80 MCG/ACT inhaler  Generic drug:  beclomethasone  Inhale 1 puff into the lungs daily as needed (shortness of breath).     sertraline 100 MG tablet  Commonly known as:  ZOLOFT  Take 150 mg by mouth daily.     SUPER B COMPLEX PO  Take 1 tablet by mouth daily.     VITAMIN C PO  Take 1 tablet by mouth daily.         Signed: Hosie Spangle, MD 08/11/2015, 7:57 AM

## 2015-09-01 DIAGNOSIS — M960 Pseudarthrosis after fusion or arthrodesis: Secondary | ICD-10-CM | POA: Diagnosis not present

## 2015-09-01 DIAGNOSIS — I10 Essential (primary) hypertension: Secondary | ICD-10-CM | POA: Diagnosis not present

## 2015-09-01 DIAGNOSIS — S129XXA Fracture of neck, unspecified, initial encounter: Secondary | ICD-10-CM | POA: Diagnosis not present

## 2015-09-01 DIAGNOSIS — Z6829 Body mass index (BMI) 29.0-29.9, adult: Secondary | ICD-10-CM | POA: Diagnosis not present

## 2015-09-01 DIAGNOSIS — Z981 Arthrodesis status: Secondary | ICD-10-CM | POA: Diagnosis not present

## 2015-10-06 DIAGNOSIS — M5441 Lumbago with sciatica, right side: Secondary | ICD-10-CM | POA: Diagnosis not present

## 2015-10-14 DIAGNOSIS — M5441 Lumbago with sciatica, right side: Secondary | ICD-10-CM | POA: Diagnosis not present

## 2015-10-30 DIAGNOSIS — M5441 Lumbago with sciatica, right side: Secondary | ICD-10-CM | POA: Diagnosis not present

## 2015-10-30 DIAGNOSIS — M5136 Other intervertebral disc degeneration, lumbar region: Secondary | ICD-10-CM | POA: Diagnosis not present

## 2015-11-03 DIAGNOSIS — M5416 Radiculopathy, lumbar region: Secondary | ICD-10-CM | POA: Diagnosis not present

## 2015-11-03 DIAGNOSIS — Z6828 Body mass index (BMI) 28.0-28.9, adult: Secondary | ICD-10-CM | POA: Diagnosis not present

## 2015-11-03 DIAGNOSIS — M960 Pseudarthrosis after fusion or arthrodesis: Secondary | ICD-10-CM | POA: Diagnosis not present

## 2015-11-03 DIAGNOSIS — Z981 Arthrodesis status: Secondary | ICD-10-CM | POA: Diagnosis not present

## 2015-11-03 DIAGNOSIS — M5136 Other intervertebral disc degeneration, lumbar region: Secondary | ICD-10-CM | POA: Diagnosis not present

## 2015-11-03 DIAGNOSIS — M5126 Other intervertebral disc displacement, lumbar region: Secondary | ICD-10-CM | POA: Diagnosis not present

## 2015-11-03 DIAGNOSIS — M4726 Other spondylosis with radiculopathy, lumbar region: Secondary | ICD-10-CM | POA: Diagnosis not present

## 2015-11-03 IMAGING — RF DG CERVICAL SPINE 1V
1 series · 1 of 1 positions shown · IV contrast (agent unspecified)
Comparison: 06/20/2014

CLINICAL DATA: Status post ACDF of C4-5 and C5-6.

EXAM:
DG C-ARM 61-120 MIN; DG CERVICAL SPINE - 1 VIEW
TECHNIQUE: C-arm radiography was employed in the operating room.
CONTRAST:  No contrast.
FLUOROSCOPY TIME:  Radiation Exposure Index (as provided by the
fluoroscopic device):
If the device does not provide the exposure index:
Fluoroscopy Time (in minutes and seconds):  11 seconds
Number of Acquired Images:  1

[Series 1: run · 1 of 1 slices shown]
[im 1/1]
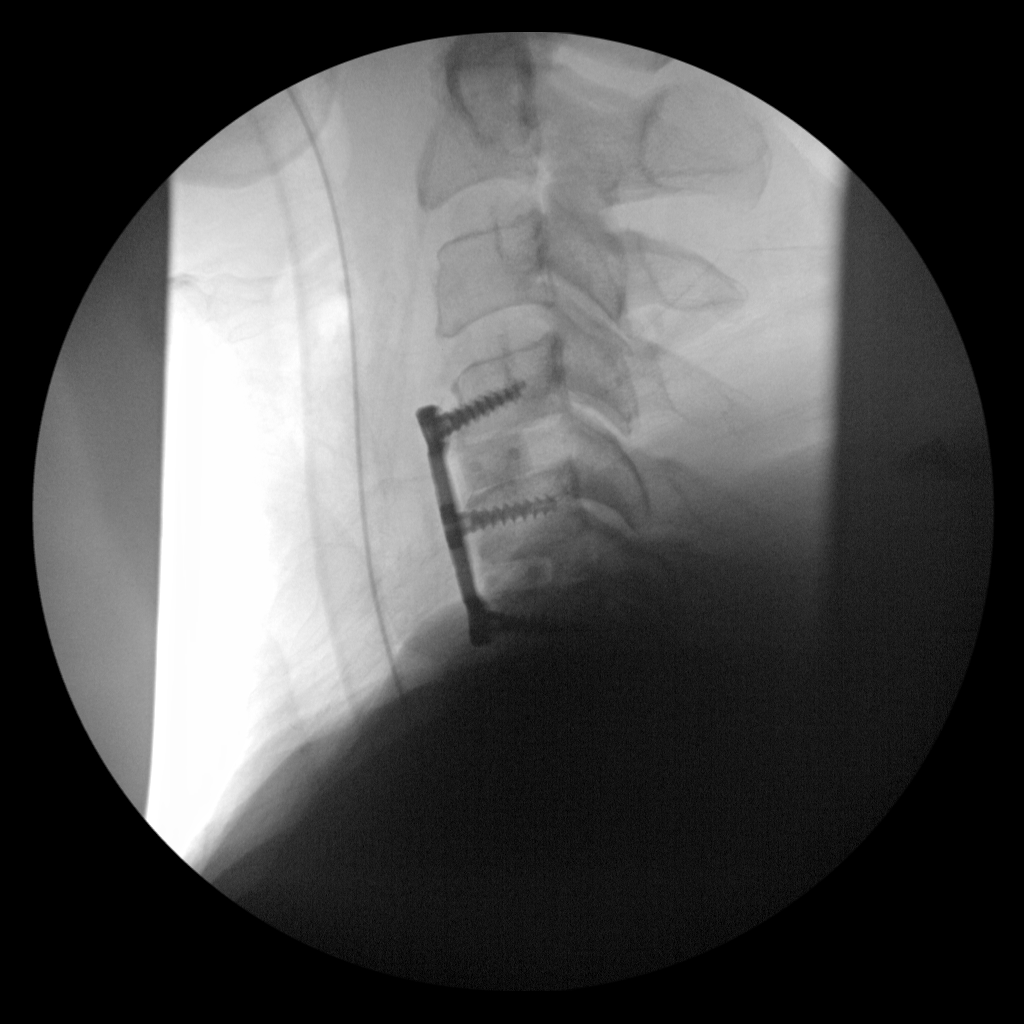

[1 of 1 positions shown; findings below may reference images not displayed]

FINDINGS: Anterior sideplate and screw device is identified at C4 through C6.
There is interbody spacer at C4-5 and C5-C6. No complications
identified.
IMPRESSION: Status post ACDF of C4 through C6.

## 2015-11-13 DIAGNOSIS — M5116 Intervertebral disc disorders with radiculopathy, lumbar region: Secondary | ICD-10-CM | POA: Diagnosis not present

## 2015-11-13 DIAGNOSIS — M5126 Other intervertebral disc displacement, lumbar region: Secondary | ICD-10-CM | POA: Diagnosis not present

## 2015-11-13 DIAGNOSIS — M5136 Other intervertebral disc degeneration, lumbar region: Secondary | ICD-10-CM | POA: Diagnosis not present

## 2015-11-13 DIAGNOSIS — M50221 Other cervical disc displacement at C4-C5 level: Secondary | ICD-10-CM | POA: Diagnosis not present

## 2015-11-13 DIAGNOSIS — M47816 Spondylosis without myelopathy or radiculopathy, lumbar region: Secondary | ICD-10-CM | POA: Diagnosis not present

## 2015-11-13 DIAGNOSIS — M4726 Other spondylosis with radiculopathy, lumbar region: Secondary | ICD-10-CM | POA: Diagnosis not present

## 2015-11-13 DIAGNOSIS — M21371 Foot drop, right foot: Secondary | ICD-10-CM | POA: Diagnosis not present

## 2015-11-25 DIAGNOSIS — R69 Illness, unspecified: Secondary | ICD-10-CM | POA: Diagnosis not present

## 2016-01-14 DIAGNOSIS — Z23 Encounter for immunization: Secondary | ICD-10-CM | POA: Diagnosis not present

## 2016-04-05 DIAGNOSIS — D224 Melanocytic nevi of scalp and neck: Secondary | ICD-10-CM | POA: Diagnosis not present

## 2016-04-05 DIAGNOSIS — D2272 Melanocytic nevi of left lower limb, including hip: Secondary | ICD-10-CM | POA: Diagnosis not present

## 2016-04-05 DIAGNOSIS — L814 Other melanin hyperpigmentation: Secondary | ICD-10-CM | POA: Diagnosis not present

## 2016-04-05 DIAGNOSIS — D225 Melanocytic nevi of trunk: Secondary | ICD-10-CM | POA: Diagnosis not present

## 2016-04-05 DIAGNOSIS — D1801 Hemangioma of skin and subcutaneous tissue: Secondary | ICD-10-CM | POA: Diagnosis not present

## 2016-06-10 DIAGNOSIS — Z86010 Personal history of colonic polyps: Secondary | ICD-10-CM | POA: Diagnosis not present

## 2016-06-10 DIAGNOSIS — Q438 Other specified congenital malformations of intestine: Secondary | ICD-10-CM | POA: Diagnosis not present

## 2016-06-13 DIAGNOSIS — R69 Illness, unspecified: Secondary | ICD-10-CM | POA: Diagnosis not present

## 2016-06-19 DIAGNOSIS — R69 Illness, unspecified: Secondary | ICD-10-CM | POA: Diagnosis not present

## 2016-07-05 DIAGNOSIS — I10 Essential (primary) hypertension: Secondary | ICD-10-CM | POA: Diagnosis not present

## 2016-07-05 DIAGNOSIS — R7301 Impaired fasting glucose: Secondary | ICD-10-CM | POA: Diagnosis not present

## 2016-07-05 DIAGNOSIS — Z125 Encounter for screening for malignant neoplasm of prostate: Secondary | ICD-10-CM | POA: Diagnosis not present

## 2016-07-05 DIAGNOSIS — E784 Other hyperlipidemia: Secondary | ICD-10-CM | POA: Diagnosis not present

## 2016-07-11 DIAGNOSIS — R7301 Impaired fasting glucose: Secondary | ICD-10-CM | POA: Diagnosis not present

## 2016-07-11 DIAGNOSIS — M19041 Primary osteoarthritis, right hand: Secondary | ICD-10-CM | POA: Diagnosis not present

## 2016-07-11 DIAGNOSIS — I10 Essential (primary) hypertension: Secondary | ICD-10-CM | POA: Diagnosis not present

## 2016-07-11 DIAGNOSIS — R69 Illness, unspecified: Secondary | ICD-10-CM | POA: Diagnosis not present

## 2016-07-11 DIAGNOSIS — Z0000 Encounter for general adult medical examination without abnormal findings: Secondary | ICD-10-CM | POA: Diagnosis not present

## 2016-07-11 DIAGNOSIS — E784 Other hyperlipidemia: Secondary | ICD-10-CM | POA: Diagnosis not present

## 2016-07-11 DIAGNOSIS — Z6829 Body mass index (BMI) 29.0-29.9, adult: Secondary | ICD-10-CM | POA: Diagnosis not present

## 2016-07-11 DIAGNOSIS — L738 Other specified follicular disorders: Secondary | ICD-10-CM | POA: Diagnosis not present

## 2016-07-11 DIAGNOSIS — Z86010 Personal history of colonic polyps: Secondary | ICD-10-CM | POA: Diagnosis not present

## 2016-07-11 DIAGNOSIS — M542 Cervicalgia: Secondary | ICD-10-CM | POA: Diagnosis not present

## 2016-07-23 IMAGING — RF DG CERVICAL SPINE 2 OR 3 VIEWS
1 series · 3 of 3 positions shown · non-contrast
Comparison: Lateral cervical spine images December 12, 2014 and
November 20, 2014.

ADDENDUM:
In the title of the exam the statement should read "DG C-ARM 61-120
MIN; CERVICAL SPINE - 2-3 VIEW".
CLINICAL DATA: Intraoperative images from posterior fusion at C4,
C5, and C6.

EXAM:
DG C-ARM 61-120 MIN; LUMBAR SPINE - 2-3 VIEW

[Series 1: run · 3 of 3 slices shown]
[im 1/3]
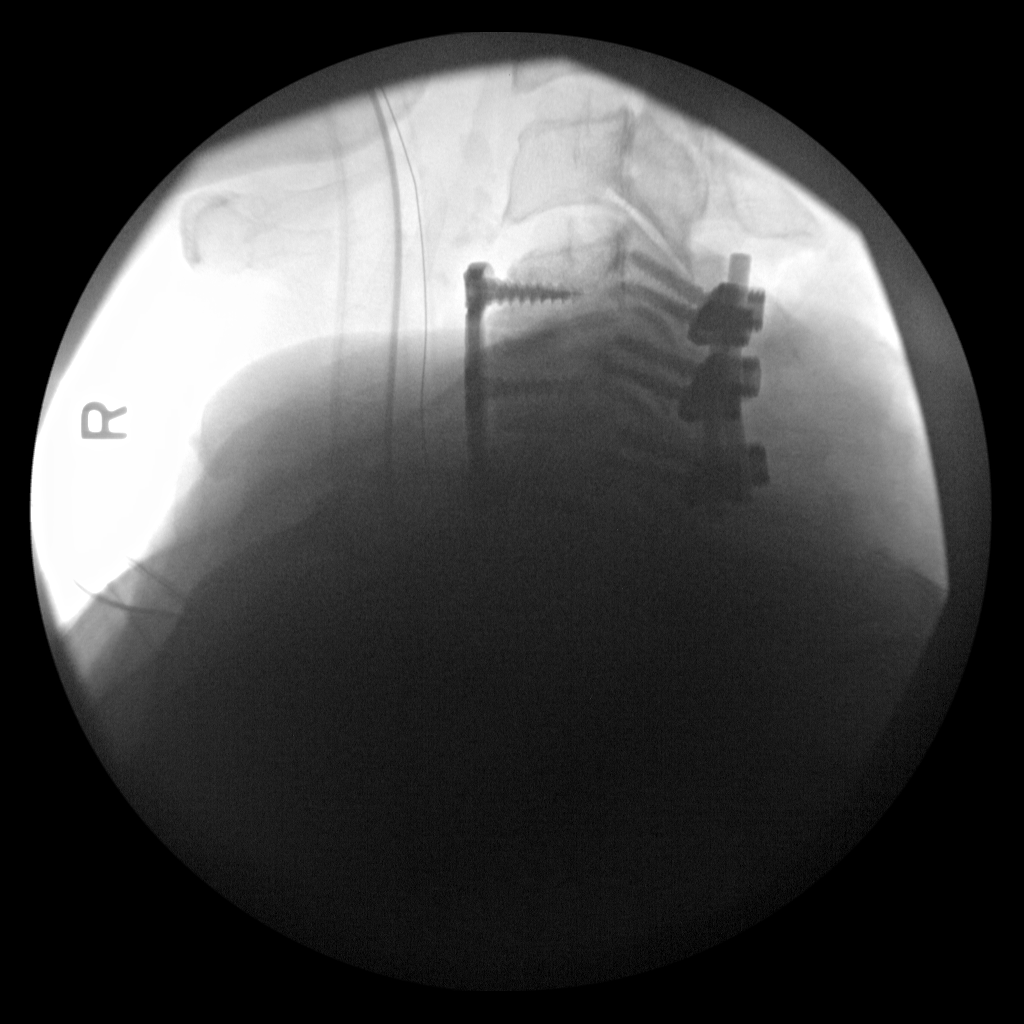
[im 2/3]
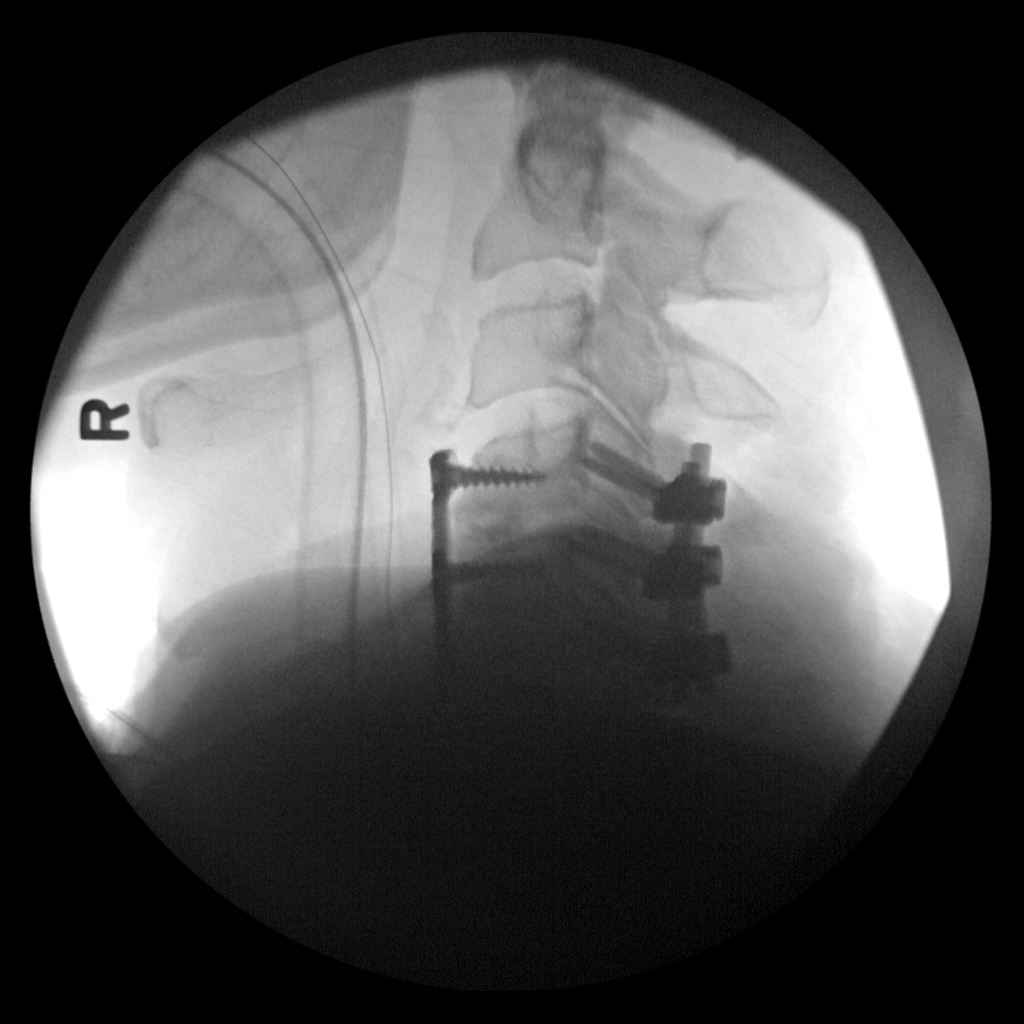
[im 3/3]
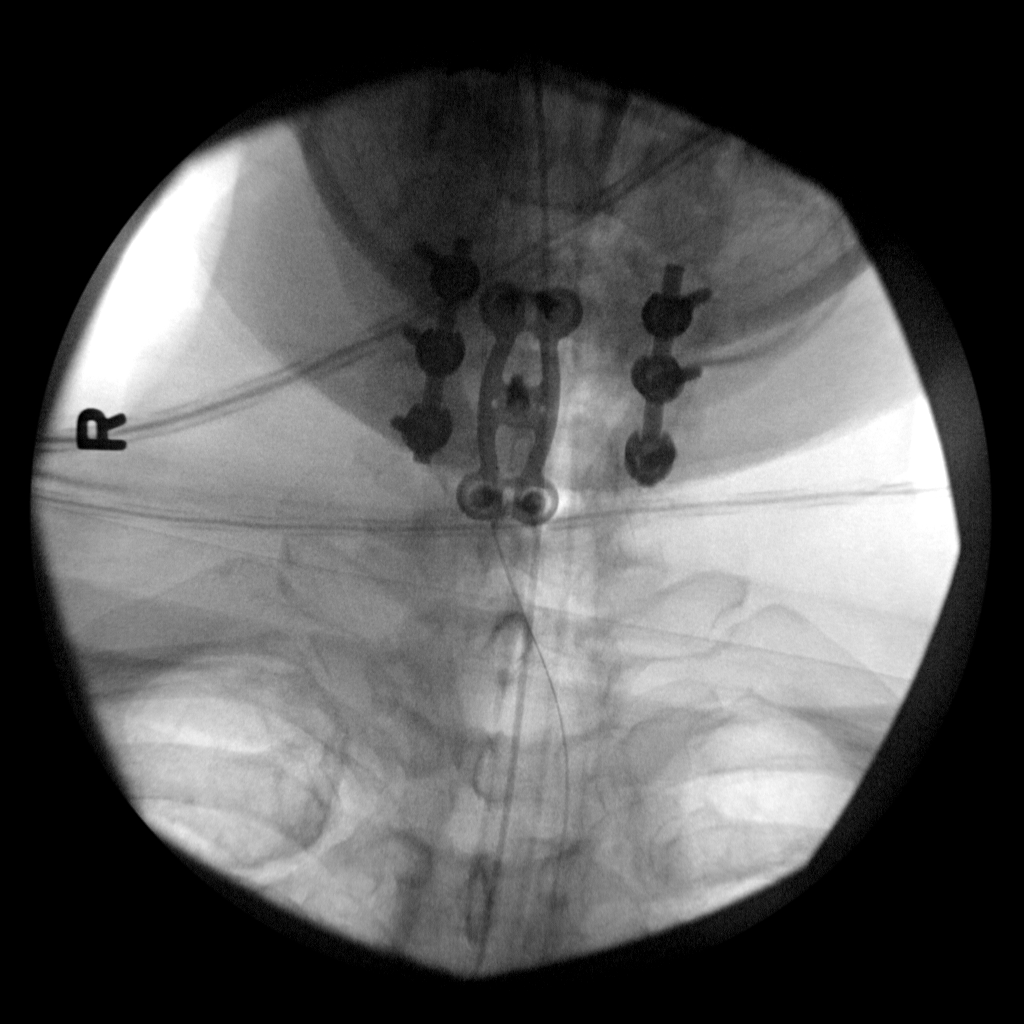

[3 of 3 positions shown; findings below may reference images not displayed]

FINDINGS: The patient has undergone posterior fusion from C4 through C6. The
metallic hardware appears appropriately position where visualized.
Pre-existing anterior cervical fusion hardware at these level
appears intact. The trachea and esophagus are intubated.
IMPRESSION: The patient has undergone posterior fusion from C4 through C6
without immediate postprocedure complication.

## 2016-10-10 DIAGNOSIS — M5416 Radiculopathy, lumbar region: Secondary | ICD-10-CM | POA: Diagnosis not present

## 2016-10-10 DIAGNOSIS — Z6829 Body mass index (BMI) 29.0-29.9, adult: Secondary | ICD-10-CM | POA: Diagnosis not present

## 2016-10-10 DIAGNOSIS — Z9889 Other specified postprocedural states: Secondary | ICD-10-CM | POA: Diagnosis not present

## 2016-10-10 DIAGNOSIS — M546 Pain in thoracic spine: Secondary | ICD-10-CM | POA: Diagnosis not present

## 2016-10-10 DIAGNOSIS — M960 Pseudarthrosis after fusion or arthrodesis: Secondary | ICD-10-CM | POA: Diagnosis not present

## 2016-11-08 DIAGNOSIS — M960 Pseudarthrosis after fusion or arthrodesis: Secondary | ICD-10-CM | POA: Diagnosis not present

## 2016-11-11 ENCOUNTER — Other Ambulatory Visit: Payer: Self-pay | Admitting: Neurosurgery

## 2016-11-11 DIAGNOSIS — M4726 Other spondylosis with radiculopathy, lumbar region: Secondary | ICD-10-CM | POA: Diagnosis not present

## 2016-11-11 DIAGNOSIS — M5136 Other intervertebral disc degeneration, lumbar region: Secondary | ICD-10-CM | POA: Diagnosis not present

## 2016-11-11 DIAGNOSIS — M960 Pseudarthrosis after fusion or arthrodesis: Secondary | ICD-10-CM | POA: Diagnosis not present

## 2016-11-11 DIAGNOSIS — M48061 Spinal stenosis, lumbar region without neurogenic claudication: Secondary | ICD-10-CM | POA: Diagnosis not present

## 2016-11-11 DIAGNOSIS — Z9889 Other specified postprocedural states: Secondary | ICD-10-CM | POA: Diagnosis not present

## 2016-11-11 DIAGNOSIS — M5416 Radiculopathy, lumbar region: Secondary | ICD-10-CM | POA: Diagnosis not present

## 2016-11-11 DIAGNOSIS — M5126 Other intervertebral disc displacement, lumbar region: Secondary | ICD-10-CM | POA: Diagnosis not present

## 2016-11-22 ENCOUNTER — Other Ambulatory Visit (HOSPITAL_COMMUNITY): Payer: Self-pay | Admitting: *Deleted

## 2016-11-22 NOTE — Pre-Procedure Instructions (Addendum)
Steve Perez  11/22/2016      [facility] [place], [address] [place] [address] Phone: [phone] Fax: [phone]  [id number] - [place], [address] [place] [address] Phone: [phone] Fax: [phone]    Your procedure is scheduled on 12-01-2016    Thursday    Report to [facility] Admitting at 5:30 A.M.   Call this number if you have problems the morning of surgery:  [phone]   Remember  Do not eat food or drink liquids after midnight.   Take these medicines the morning of surgery with A SIP OF WATER pain medication if needed,Proventil inhaler if needed(bring with you the day of surgery,Qvar inhaler if needed,Sertraline(Zoloft)        STOP ASPIRIN,ANTIINFLAMATORIESmeloxicam (IBUPROFEN,ALEVE,MOTRIN,ADVIL,GOODY'S, POWDERS)ASPIRIN,HERBAL SUPPLEMENTS,FISH OIL,AND VITAMINS 7 DAYS PRIOR TO SURGERY(11/24/16   Do not wear jewelry,   Do not wear lotions, powders, or perfumes, or deoderant.  Do not shave 48 hours prior to surgery.  Men may shave face and neck.   Do not bring valuables to the hospital.  [facility] is not responsible for any belongings or valuables.  Contacts, dentures or bridgework may not be worn into surgery.  Leave your suitcase in the car.  After surgery it may be brought to your room.  For patients admitted to the hospital, discharge time will be determined by your treatment team.  Patients discharged the day of surgery will not be allowed to drive home.      Special Instructions: [facility] - Preparing for Surgery  Before surgery, you can play an important role.  Because skin is not sterile, your skin needs to be as free of germs as possible.  You can reduce the number of germs on you skin by washing with CHG (chlorahexidine gluconate) soap before surgery.  CHG is an  antiseptic cleaner which kills germs and bonds with the skin to continue killing germs even after washing.  Please DO NOT use if you have an allergy to CHG or antibacterial soaps.  If your skin becomes reddened/irritated stop using the CHG and inform your nurse when you arrive at Short Stay.  Do not shave (including legs and underarms) for at least 48 hours prior to the first CHG shower.  You may shave your face.  Please follow these instructions carefully:   1.  Shower with CHG Soap the night before surgery and the   morning of Surgery.  2.  If you choose to wash your hair, wash your hair first as usual with your normal shampoo.  3.  After you shampoo, rinse your hair and body thoroughly to remove the  Shampoo.  4.  Use CHG as you would any other liquid soap.  You can apply chg directly  to the skin and wash gently with scrungie or a clean washcloth.  5.  Apply the CHG Soap to your body ONLY FROM THE NECK DOWN.   Do not use on open wounds or open sores.  Avoid contact with your eyes,  ears, mouth and genitals (private parts).  Wash genitals (private parts) with your normal soap.  6.  Wash thoroughly, paying special attention to the area where your surgery will be performed.  7.  Thoroughly rinse your body with warm water from the neck down.  8.  DO NOT  shower/wash with your normal soap after using and rinsing o  the CHG Soap.  9.  Pat yourself dry with a clean towel.            10.  Wear clean pajamas.            11.  Place clean sheets on your bed the night of your first shower and do not sleep with pets.  Day of Surgery  Do not apply any lotions/deodorants the morning of surgery.  Please wear clean clothes to the hospital/surgery center.    Please read over the  fact sheets that you were given.

## 2016-11-23 ENCOUNTER — Encounter (HOSPITAL_COMMUNITY): Payer: Self-pay

## 2016-11-23 ENCOUNTER — Encounter (HOSPITAL_COMMUNITY)
Admission: RE | Admit: 2016-11-23 | Discharge: 2016-11-23 | Disposition: A | Discharge status: Home / Self Care | Payer: Medicare HMO | Source: Ambulatory Visit | Attending: Neurosurgery | Admitting: Neurosurgery

## 2016-11-23 DIAGNOSIS — Z01818 Encounter for other preprocedural examination: Secondary | ICD-10-CM | POA: Insufficient documentation

## 2016-11-23 DIAGNOSIS — R001 Bradycardia, unspecified: Secondary | ICD-10-CM | POA: Diagnosis not present

## 2016-11-23 DIAGNOSIS — Z01812 Encounter for preprocedural laboratory examination: Secondary | ICD-10-CM | POA: Diagnosis not present

## 2016-11-23 DIAGNOSIS — Z0183 Encounter for blood typing: Secondary | ICD-10-CM | POA: Diagnosis not present

## 2016-11-23 DIAGNOSIS — M5126 Other intervertebral disc displacement, lumbar region: Secondary | ICD-10-CM | POA: Diagnosis not present

## 2016-11-23 LAB — BASIC METABOLIC PANEL
ANION GAP: 8 (ref 5–15)
BUN: 16 mg/dL (ref 6–20)
CALCIUM: 9.3 mg/dL (ref 8.9–10.3)
CO2: 27 mmol/L (ref 22–32)
CREATININE: 0.98 mg/dL (ref 0.61–1.24)
Chloride: 101 mmol/L (ref 101–111)
GFR calc Af Amer: >60 mL/min (ref >60)
GLUCOSE: 94 mg/dL (ref 65–99)
Potassium: 4.7 mmol/L (ref 3.5–5.1)
Sodium: 136 mmol/L (ref 135–145)

## 2016-11-23 LAB — CBC
HCT: 41.8 % (ref 39.0–52.0)
HEMOGLOBIN: 14.3 g/dL (ref 13.0–17.0)
MCH: 32.6 pg (ref 26.0–34.0)
MCHC: 34.2 g/dL (ref 30.0–36.0)
MCV: 95.4 fL (ref 78.0–100.0)
Platelets: 214 10*3/uL (ref 150–400)
RBC: 4.38 MIL/uL (ref 4.22–5.81)
RDW: 12.8 % (ref 11.5–15.5)
WBC: 6.5 10*3/uL (ref 4.0–10.5)

## 2016-11-23 LAB — SURGICAL PCR SCREEN
MRSA, PCR: NEGATIVE
Staphylococcus aureus: NEGATIVE

## 2016-11-23 LAB — TYPE AND SCREEN
ABO/RH(D): A POS
Antibody Screen: NEGATIVE

## 2016-11-23 LAB — ABO/RH: ABO/RH(D): A POS

## 2016-12-01 ENCOUNTER — Inpatient Hospital Stay (HOSPITAL_COMMUNITY): Payer: Medicare HMO

## 2016-12-01 ENCOUNTER — Encounter (HOSPITAL_COMMUNITY): Payer: Self-pay | Admitting: *Deleted

## 2016-12-01 ENCOUNTER — Inpatient Hospital Stay (HOSPITAL_COMMUNITY)
Admission: RE | Admit: 2016-12-01 | Discharge: 2016-12-02 | DRG: 455 | Disposition: A | Discharge status: Home / Self Care | Payer: Medicare HMO | Source: Ambulatory Visit | Attending: Neurosurgery | Admitting: Neurosurgery

## 2016-12-01 ENCOUNTER — Inpatient Hospital Stay (HOSPITAL_COMMUNITY): Payer: Medicare HMO | Admitting: Anesthesiology

## 2016-12-01 ENCOUNTER — Encounter (HOSPITAL_COMMUNITY)
Admission: RE | Disposition: A | Discharge status: Home / Self Care | Payer: Self-pay | Source: Ambulatory Visit | Attending: Neurosurgery

## 2016-12-01 DIAGNOSIS — M48061 Spinal stenosis, lumbar region without neurogenic claudication: Secondary | ICD-10-CM | POA: Diagnosis not present

## 2016-12-01 DIAGNOSIS — M5020 Other cervical disc displacement, unspecified cervical region: Secondary | ICD-10-CM | POA: Diagnosis not present

## 2016-12-01 DIAGNOSIS — M5126 Other intervertebral disc displacement, lumbar region: Secondary | ICD-10-CM | POA: Diagnosis not present

## 2016-12-01 DIAGNOSIS — J45909 Unspecified asthma, uncomplicated: Secondary | ICD-10-CM | POA: Diagnosis not present

## 2016-12-01 DIAGNOSIS — I10 Essential (primary) hypertension: Secondary | ICD-10-CM | POA: Diagnosis present

## 2016-12-01 DIAGNOSIS — Z0389 Encounter for observation for other suspected diseases and conditions ruled out: Secondary | ICD-10-CM | POA: Diagnosis not present

## 2016-12-01 DIAGNOSIS — Z7982 Long term (current) use of aspirin: Secondary | ICD-10-CM | POA: Diagnosis not present

## 2016-12-01 DIAGNOSIS — M5136 Other intervertebral disc degeneration, lumbar region: Secondary | ICD-10-CM | POA: Diagnosis not present

## 2016-12-01 DIAGNOSIS — M4326 Fusion of spine, lumbar region: Secondary | ICD-10-CM | POA: Diagnosis not present

## 2016-12-01 DIAGNOSIS — F17290 Nicotine dependence, other tobacco product, uncomplicated: Secondary | ICD-10-CM | POA: Diagnosis present

## 2016-12-01 DIAGNOSIS — M5116 Intervertebral disc disorders with radiculopathy, lumbar region: Secondary | ICD-10-CM | POA: Diagnosis not present

## 2016-12-01 DIAGNOSIS — F410 Panic disorder [episodic paroxysmal anxiety] without agoraphobia: Secondary | ICD-10-CM | POA: Diagnosis present

## 2016-12-01 DIAGNOSIS — R69 Illness, unspecified: Secondary | ICD-10-CM | POA: Diagnosis not present

## 2016-12-01 DIAGNOSIS — M4726 Other spondylosis with radiculopathy, lumbar region: Secondary | ICD-10-CM | POA: Diagnosis not present

## 2016-12-01 DIAGNOSIS — Z791 Long term (current) use of non-steroidal anti-inflammatories (NSAID): Secondary | ICD-10-CM

## 2016-12-01 DIAGNOSIS — Z981 Arthrodesis status: Secondary | ICD-10-CM

## 2016-12-01 DIAGNOSIS — M545 Low back pain: Secondary | ICD-10-CM | POA: Diagnosis present

## 2016-12-01 DIAGNOSIS — Z419 Encounter for procedure for purposes other than remedying health state, unspecified: Secondary | ICD-10-CM

## 2016-12-01 SURGERY — POSTERIOR LUMBAR FUSION 1 LEVEL
Anesthesia: General | Site: Back

## 2016-12-01 MED ORDER — THROMBIN 5000 UNITS EX SOLR
OROMUCOSAL | Status: DC | PRN
  Administered 2016-12-01: 08:00:00 via TOPICAL

## 2016-12-01 MED ORDER — BUPIVACAINE HCL (PF) 0.5 % IJ SOLN
INTRAMUSCULAR | Status: AC
  Filled 2016-12-01: qty 30

## 2016-12-01 MED ORDER — CYCLOBENZAPRINE HCL 5 MG PO TABS
5.0000 mg | ORAL_TABLET | Freq: Three times a day (TID) | ORAL | Status: DC | PRN
  Administered 2016-12-01 (×2): 10 mg via ORAL
  Filled 2016-12-01: qty 2

## 2016-12-01 MED ORDER — ONDANSETRON HCL 4 MG/2ML IJ SOLN
INTRAMUSCULAR | Status: AC
  Filled 2016-12-01: qty 2

## 2016-12-01 MED ORDER — MENTHOL 3 MG MT LOZG: 1.0000 | LOZENGE | OROMUCOSAL | Status: DC | PRN

## 2016-12-01 MED ORDER — KETOROLAC TROMETHAMINE 15 MG/ML IJ SOLN
INTRAMUSCULAR | Status: AC
  Filled 2016-12-01: qty 1

## 2016-12-01 MED ORDER — BUPIVACAINE HCL (PF) 0.5 % IJ SOLN
INTRAMUSCULAR | Status: DC | PRN
  Administered 2016-12-01: 20 mL

## 2016-12-01 MED ORDER — THROMBIN 20000 UNITS EX SOLR
CUTANEOUS | Status: DC | PRN
  Administered 2016-12-01: 08:00:00 via TOPICAL

## 2016-12-01 MED ORDER — ATORVASTATIN CALCIUM 20 MG PO TABS
20.0000 mg | ORAL_TABLET | Freq: Every day | ORAL | Status: DC
Start: 2016-12-01 — End: 2016-12-02
  Administered 2016-12-01: 20 mg via ORAL
  Filled 2016-12-01 (×2): qty 1

## 2016-12-01 MED ORDER — ACETAMINOPHEN 10 MG/ML IV SOLN
INTRAVENOUS | Status: AC
  Filled 2016-12-01: qty 100

## 2016-12-01 MED ORDER — KETOROLAC TROMETHAMINE 15 MG/ML IJ SOLN
15.0000 mg | Freq: Four times a day (QID) | INTRAMUSCULAR | Status: DC
  Administered 2016-12-01 – 2016-12-02 (×3): 15 mg via INTRAVENOUS
  Filled 2016-12-01 (×3): qty 1

## 2016-12-01 MED ORDER — HYDROXYZINE HCL 25 MG PO TABS: 50.0000 mg | ORAL_TABLET | ORAL | Status: DC | PRN

## 2016-12-01 MED ORDER — ACETAMINOPHEN 325 MG PO TABS: 650.0000 mg | ORAL_TABLET | ORAL | Status: DC | PRN

## 2016-12-01 MED ORDER — CEFAZOLIN SODIUM-DEXTROSE 2-4 GM/100ML-% IV SOLN
2.0000 g | INTRAVENOUS | Status: AC
  Administered 2016-12-01 (×2): 2 g via INTRAVENOUS
  Filled 2016-12-01: qty 100

## 2016-12-01 MED ORDER — ALBUTEROL SULFATE HFA 108 (90 BASE) MCG/ACT IN AERS
2.0000 | INHALATION_SPRAY | Freq: Four times a day (QID) | RESPIRATORY_TRACT | Status: DC | PRN
  Administered 2016-12-01: 2 via RESPIRATORY_TRACT

## 2016-12-01 MED ORDER — PROPOFOL 10 MG/ML IV BOLUS
INTRAVENOUS | Status: DC | PRN
  Administered 2016-12-01: 110 mg via INTRAVENOUS
  Administered 2016-12-01: 30 mg via INTRAVENOUS

## 2016-12-01 MED ORDER — LIDOCAINE-EPINEPHRINE 2 %-1:100000 IJ SOLN
INTRAMUSCULAR | Status: DC | PRN
  Administered 2016-12-01: 20 mL via INTRADERMAL

## 2016-12-01 MED ORDER — PHENOL 1.4 % MT LIQD: 1.0000 | OROMUCOSAL | Status: DC | PRN

## 2016-12-01 MED ORDER — LIDOCAINE-EPINEPHRINE 2 %-1:100000 IJ SOLN
INTRAMUSCULAR | Status: AC
  Filled 2016-12-01: qty 1

## 2016-12-01 MED ORDER — SERTRALINE HCL 100 MG PO TABS
150.0000 mg | ORAL_TABLET | Freq: Every day | ORAL | Status: DC
  Administered 2016-12-01: 150 mg via ORAL
  Filled 2016-12-01 (×2): qty 1

## 2016-12-01 MED ORDER — SODIUM CHLORIDE 0.9 % IR SOLN
Status: DC | PRN
  Administered 2016-12-01: 08:00:00

## 2016-12-01 MED ORDER — PHENYLEPHRINE 40 MCG/ML (10ML) SYRINGE FOR IV PUSH (FOR BLOOD PRESSURE SUPPORT)
PREFILLED_SYRINGE | INTRAVENOUS | Status: AC
  Filled 2016-12-01: qty 10

## 2016-12-01 MED ORDER — OXYCODONE HCL 5 MG/5ML PO SOLN: 5.0000 mg | Freq: Once | ORAL | Status: AC | PRN

## 2016-12-01 MED ORDER — CYCLOBENZAPRINE HCL 10 MG PO TABS
ORAL_TABLET | ORAL | Status: AC
  Filled 2016-12-01: qty 1

## 2016-12-01 MED ORDER — PROPOFOL 10 MG/ML IV BOLUS
INTRAVENOUS | Status: AC
  Filled 2016-12-01: qty 20

## 2016-12-01 MED ORDER — LISINOPRIL 20 MG PO TABS: 20.0000 mg | ORAL_TABLET | Freq: Every day | ORAL | Status: DC

## 2016-12-01 MED ORDER — EPHEDRINE SULFATE 50 MG/ML IJ SOLN
INTRAMUSCULAR | Status: DC | PRN
  Administered 2016-12-01: 10 mg via INTRAVENOUS

## 2016-12-01 MED ORDER — CHLORHEXIDINE GLUCONATE CLOTH 2 % EX PADS: 6.0000 | MEDICATED_PAD | Freq: Once | CUTANEOUS | Status: DC

## 2016-12-01 MED ORDER — ROCURONIUM BROMIDE 50 MG/5ML IV SOSY
PREFILLED_SYRINGE | INTRAVENOUS | Status: AC
  Filled 2016-12-01: qty 5

## 2016-12-01 MED ORDER — LIDOCAINE 2% (20 MG/ML) 5 ML SYRINGE
INTRAMUSCULAR | Status: AC
  Filled 2016-12-01: qty 5

## 2016-12-01 MED ORDER — FLEET ENEMA 7-19 GM/118ML RE ENEM: 1.0000 | ENEMA | Freq: Once | RECTAL | Status: DC | PRN

## 2016-12-01 MED ORDER — ALUM & MAG HYDROXIDE-SIMETH 200-200-20 MG/5ML PO SUSP: 30.0000 mL | Freq: Four times a day (QID) | ORAL | Status: DC | PRN

## 2016-12-01 MED ORDER — FENTANYL CITRATE (PF) 100 MCG/2ML IJ SOLN
INTRAMUSCULAR | Status: AC
  Filled 2016-12-01: qty 4

## 2016-12-01 MED ORDER — LACTATED RINGERS IV SOLN
INTRAVENOUS | Status: DC | PRN
  Administered 2016-12-01 (×3): via INTRAVENOUS

## 2016-12-01 MED ORDER — SODIUM CHLORIDE 0.9 % IJ SOLN
INTRAMUSCULAR | Status: AC
  Filled 2016-12-01: qty 10

## 2016-12-01 MED ORDER — MORPHINE SULFATE (PF) 4 MG/ML IV SOLN
4.0000 mg | INTRAVENOUS | Status: DC | PRN
  Administered 2016-12-01: 4 mg via INTRAMUSCULAR
  Filled 2016-12-01: qty 1

## 2016-12-01 MED ORDER — FENTANYL CITRATE (PF) 100 MCG/2ML IJ SOLN
INTRAMUSCULAR | Status: AC
Start: 2016-12-01 — End: 2016-12-01
  Filled 2016-12-01: qty 4

## 2016-12-01 MED ORDER — THROMBIN 20000 UNITS EX SOLR
CUTANEOUS | Status: AC
  Filled 2016-12-01: qty 20000

## 2016-12-01 MED ORDER — ONDANSETRON HCL 4 MG/2ML IJ SOLN
INTRAMUSCULAR | Status: DC | PRN
  Administered 2016-12-01: 4 mg via INTRAVENOUS

## 2016-12-01 MED ORDER — VECURONIUM BROMIDE 10 MG IV SOLR
INTRAVENOUS | Status: AC
  Filled 2016-12-01: qty 10

## 2016-12-01 MED ORDER — OXYCODONE HCL 5 MG PO TABS
ORAL_TABLET | ORAL | Status: AC
  Filled 2016-12-01: qty 1

## 2016-12-01 MED ORDER — ALBUTEROL SULFATE (2.5 MG/3ML) 0.083% IN NEBU: 2.5000 mg | INHALATION_SOLUTION | Freq: Four times a day (QID) | RESPIRATORY_TRACT | Status: DC | PRN

## 2016-12-01 MED ORDER — HYDROXYZINE HCL 50 MG/ML IM SOLN: 50.0000 mg | INTRAMUSCULAR | Status: DC | PRN

## 2016-12-01 MED ORDER — MAGNESIUM HYDROXIDE 400 MG/5ML PO SUSP: 30.0000 mL | Freq: Every day | ORAL | Status: DC | PRN

## 2016-12-01 MED ORDER — DEXTROSE-NACL 5-0.45 % IV SOLN: INTRAVENOUS | Status: DC

## 2016-12-01 MED ORDER — MIDAZOLAM HCL 2 MG/2ML IJ SOLN
INTRAMUSCULAR | Status: AC
  Filled 2016-12-01: qty 2

## 2016-12-01 MED ORDER — SUGAMMADEX SODIUM 200 MG/2ML IV SOLN
INTRAVENOUS | Status: DC | PRN
  Administered 2016-12-01: 200 mg via INTRAVENOUS

## 2016-12-01 MED ORDER — PHENYLEPHRINE HCL 10 MG/ML IJ SOLN
INTRAMUSCULAR | Status: DC | PRN
  Administered 2016-12-01 (×3): 80 ug via INTRAVENOUS

## 2016-12-01 MED ORDER — VECURONIUM BROMIDE 10 MG IV SOLR
INTRAVENOUS | Status: DC | PRN
Start: 2016-12-01 — End: 2016-12-01
  Administered 2016-12-01: 3 mg via INTRAVENOUS

## 2016-12-01 MED ORDER — FENTANYL CITRATE (PF) 100 MCG/2ML IJ SOLN
INTRAMUSCULAR | Status: AC
  Filled 2016-12-01: qty 2

## 2016-12-01 MED ORDER — OXYCODONE HCL 5 MG PO TABS
5.0000 mg | ORAL_TABLET | Freq: Once | ORAL | Status: AC | PRN
  Administered 2016-12-01: 5 mg via ORAL

## 2016-12-01 MED ORDER — DEXAMETHASONE SODIUM PHOSPHATE 10 MG/ML IJ SOLN
INTRAMUSCULAR | Status: DC | PRN
  Administered 2016-12-01: 10 mg via INTRAVENOUS

## 2016-12-01 MED ORDER — 0.9 % SODIUM CHLORIDE (POUR BTL) OPTIME
TOPICAL | Status: DC | PRN
  Administered 2016-12-01 (×2): 1000 mL

## 2016-12-01 MED ORDER — ONDANSETRON HCL 4 MG/2ML IJ SOLN: 4.0000 mg | Freq: Four times a day (QID) | INTRAMUSCULAR | Status: DC | PRN

## 2016-12-01 MED ORDER — ROCURONIUM BROMIDE 100 MG/10ML IV SOLN
INTRAVENOUS | Status: DC | PRN
  Administered 2016-12-01: 50 mg via INTRAVENOUS
  Administered 2016-12-01 (×2): 30 mg via INTRAVENOUS
  Administered 2016-12-01 (×2): 20 mg via INTRAVENOUS

## 2016-12-01 MED ORDER — ONDANSETRON HCL 4 MG PO TABS: 4.0000 mg | ORAL_TABLET | Freq: Four times a day (QID) | ORAL | Status: DC | PRN

## 2016-12-01 MED ORDER — PHENYLEPHRINE HCL 10 MG/ML IJ SOLN
INTRAVENOUS | Status: DC | PRN
  Administered 2016-12-01: 25 ug/min via INTRAVENOUS

## 2016-12-01 MED ORDER — EPHEDRINE 5 MG/ML INJ
INTRAVENOUS | Status: AC
  Filled 2016-12-01: qty 10

## 2016-12-01 MED ORDER — DEXAMETHASONE SODIUM PHOSPHATE 10 MG/ML IJ SOLN
INTRAMUSCULAR | Status: AC
  Filled 2016-12-01: qty 1

## 2016-12-01 MED ORDER — ACETAMINOPHEN 650 MG RE SUPP: 650.0000 mg | RECTAL | Status: DC | PRN

## 2016-12-01 MED ORDER — SODIUM CHLORIDE 0.9% FLUSH: 3.0000 mL | INTRAVENOUS | Status: DC | PRN

## 2016-12-01 MED ORDER — FENTANYL CITRATE (PF) 100 MCG/2ML IJ SOLN
INTRAMUSCULAR | Status: DC | PRN
Start: 2016-12-01 — End: 2016-12-01
  Administered 2016-12-01: 100 ug via INTRAVENOUS
  Administered 2016-12-01: 150 ug via INTRAVENOUS
  Administered 2016-12-01 (×2): 100 ug via INTRAVENOUS
  Administered 2016-12-01: 50 ug via INTRAVENOUS
  Administered 2016-12-01 (×2): 100 ug via INTRAVENOUS

## 2016-12-01 MED ORDER — HYDROMORPHONE HCL 1 MG/ML IJ SOLN
0.2500 mg | INTRAMUSCULAR | Status: DC | PRN
  Administered 2016-12-01 (×2): 0.5 mg via INTRAVENOUS

## 2016-12-01 MED ORDER — HYDROMORPHONE HCL 1 MG/ML IJ SOLN
INTRAMUSCULAR | Status: AC
  Filled 2016-12-01: qty 1

## 2016-12-01 MED ORDER — HYDROCODONE-ACETAMINOPHEN 5-325 MG PO TABS
1.0000 | ORAL_TABLET | ORAL | Status: DC | PRN
  Administered 2016-12-01 – 2016-12-02 (×4): 2 via ORAL
  Filled 2016-12-01 (×4): qty 2

## 2016-12-01 MED ORDER — LIDOCAINE HCL (CARDIAC) 20 MG/ML IV SOLN
INTRAVENOUS | Status: DC | PRN
  Administered 2016-12-01: 60 mg via INTRAVENOUS

## 2016-12-01 MED ORDER — MIDAZOLAM HCL 5 MG/5ML IJ SOLN
INTRAMUSCULAR | Status: DC | PRN
  Administered 2016-12-01: 2 mg via INTRAVENOUS

## 2016-12-01 MED ORDER — SODIUM CHLORIDE 0.9% FLUSH
3.0000 mL | Freq: Two times a day (BID) | INTRAVENOUS | Status: DC
  Administered 2016-12-01: 3 mL via INTRAVENOUS

## 2016-12-01 MED ORDER — ACETAMINOPHEN 10 MG/ML IV SOLN
INTRAVENOUS | Status: DC | PRN
  Administered 2016-12-01: 1000 mg via INTRAVENOUS

## 2016-12-01 MED ORDER — ALBUTEROL SULFATE HFA 108 (90 BASE) MCG/ACT IN AERS
INHALATION_SPRAY | RESPIRATORY_TRACT | Status: AC
  Filled 2016-12-01: qty 6.7

## 2016-12-01 MED ORDER — KETOROLAC TROMETHAMINE 15 MG/ML IJ SOLN
15.0000 mg | Freq: Once | INTRAMUSCULAR | Status: AC
  Administered 2016-12-01: 15 mg via INTRAVENOUS

## 2016-12-01 MED ORDER — BISACODYL 10 MG RE SUPP: 10.0000 mg | Freq: Every day | RECTAL | Status: DC | PRN

## 2016-12-01 MED ORDER — THROMBIN 5000 UNITS EX SOLR
CUTANEOUS | Status: AC
  Filled 2016-12-01: qty 5000

## 2016-12-01 SURGICAL SUPPLY — 72 items
ADH SKN CLS APL DERMABOND .7 (GAUZE/BANDAGES/DRESSINGS) ×1
BAG DECANTER FOR FLEXI CONT (MISCELLANEOUS) ×2 IMPLANT
BLADE CLIPPER SURG (BLADE) IMPLANT
BUR ACRON 5.0MM COATED (BURR) ×2 IMPLANT
BUR MATCHSTICK NEURO 3.0 LAGG (BURR) ×2 IMPLANT
CANISTER SUCT 3000ML PPV (MISCELLANEOUS) ×2 IMPLANT
CAP LCK SPNE (Orthopedic Implant) ×4 IMPLANT
CAP LOCK SPINE RADIUS (Orthopedic Implant) IMPLANT
CAP LOCKING (Orthopedic Implant) ×8 IMPLANT
CARTRIDGE OIL MAESTRO DRILL (MISCELLANEOUS) ×1 IMPLANT
CONT SPEC 4OZ CLIKSEAL STRL BL (MISCELLANEOUS) ×2 IMPLANT
COVER BACK TABLE 60X90IN (DRAPES) ×2 IMPLANT
DERMABOND ADVANCED (GAUZE/BANDAGES/DRESSINGS) ×1
DERMABOND ADVANCED .7 DNX12 (GAUZE/BANDAGES/DRESSINGS) ×2 IMPLANT
DIFFUSER DRILL AIR PNEUMATIC (MISCELLANEOUS) ×2 IMPLANT
DRAPE C-ARM 42X72 X-RAY (DRAPES) ×4 IMPLANT
DRAPE HALF SHEET 40X57 (DRAPES) ×1 IMPLANT
DRAPE LAPAROTOMY 100X72X124 (DRAPES) ×2 IMPLANT
DRAPE POUCH INSTRU U-SHP 10X18 (DRAPES) ×2 IMPLANT
ELECT REM PT RETURN 9FT ADLT (ELECTROSURGICAL) ×2
ELECTRODE REM PT RTRN 9FT ADLT (ELECTROSURGICAL) ×1 IMPLANT
GAUZE SPONGE 4X4 12PLY STRL (GAUZE/BANDAGES/DRESSINGS) ×2 IMPLANT
GAUZE SPONGE 4X4 16PLY XRAY LF (GAUZE/BANDAGES/DRESSINGS) IMPLANT
GLOVE BIO SURGEON STRL SZ8 (GLOVE) ×1 IMPLANT
GLOVE BIOGEL PI IND STRL 8 (GLOVE) ×2 IMPLANT
GLOVE BIOGEL PI INDICATOR 8 (GLOVE) ×2
GLOVE ECLIPSE 7.5 STRL STRAW (GLOVE) ×4 IMPLANT
GLOVE INDICATOR 7.5 STRL GRN (GLOVE) ×2 IMPLANT
GLOVE SS BIOGEL STRL SZ 7 (GLOVE) IMPLANT
GLOVE SUPERSENSE BIOGEL SZ 7 (GLOVE) ×2
GLOVE SURG SS PI 7.0 STRL IVOR (GLOVE) ×3 IMPLANT
GOWN STRL REUS W/ TWL LRG LVL3 (GOWN DISPOSABLE) ×1 IMPLANT
GOWN STRL REUS W/ TWL XL LVL3 (GOWN DISPOSABLE) ×1 IMPLANT
GOWN STRL REUS W/TWL 2XL LVL3 (GOWN DISPOSABLE) IMPLANT
GOWN STRL REUS W/TWL LRG LVL3 (GOWN DISPOSABLE) ×2
GOWN STRL REUS W/TWL XL LVL3 (GOWN DISPOSABLE) ×6
KIT BASIN OR (CUSTOM PROCEDURE TRAY) ×2 IMPLANT
KIT INFUSE SMALL (Orthopedic Implant) ×1 IMPLANT
KIT ROOM TURNOVER OR (KITS) ×2 IMPLANT
NDL ASP BONE MRW 8GX15 (NEEDLE) IMPLANT
NDL SPNL 18GX3.5 QUINCKE PK (NEEDLE) IMPLANT
NDL SPNL 22GX3.5 QUINCKE BK (NEEDLE) ×2 IMPLANT
NEEDLE ASP BONE MRW 8GX15 (NEEDLE) ×2 IMPLANT
NEEDLE SPNL 18GX3.5 QUINCKE PK (NEEDLE) ×4 IMPLANT
NEEDLE SPNL 22GX3.5 QUINCKE BK (NEEDLE) ×4 IMPLANT
NS IRRIG 1000ML POUR BTL (IV SOLUTION) ×3 IMPLANT
OIL CARTRIDGE MAESTRO DRILL (MISCELLANEOUS) ×2
PACK LAMINECTOMY NEURO (CUSTOM PROCEDURE TRAY) ×2 IMPLANT
PAD ARMBOARD 7.5X6 YLW CONV (MISCELLANEOUS) ×6 IMPLANT
PATTIES SURGICAL .5 X.5 (GAUZE/BANDAGES/DRESSINGS) IMPLANT
PATTIES SURGICAL .5 X1 (DISPOSABLE) IMPLANT
PATTIES SURGICAL 1X1 (DISPOSABLE) IMPLANT
PEEK PLIF AVS 10X25X4 (Peek) ×2 IMPLANT
ROD RADIUS 35MM (Rod) ×1 IMPLANT
ROD RADIUS 40MM (Neuro Prosthesis/Implant) ×2 IMPLANT
ROD SPNL 40X5.5XNS TI RDS (Neuro Prosthesis/Implant) IMPLANT
SCREW 5.75X45MM (Screw) ×4 IMPLANT
SPONGE LAP 4X18 X RAY DECT (DISPOSABLE) IMPLANT
SPONGE NEURO XRAY DETECT 1X3 (DISPOSABLE) IMPLANT
SPONGE SURGIFOAM ABS GEL 100 (HEMOSTASIS) ×2 IMPLANT
STRIP BIOACTIVE VITOSS 25X100X (Neuro Prosthesis/Implant) ×1 IMPLANT
STRIP VITOSS 25X50X4MM (Neuro Prosthesis/Implant) ×1 IMPLANT
SUT VIC AB 1 CT1 18XBRD ANBCTR (SUTURE) ×2 IMPLANT
SUT VIC AB 1 CT1 8-18 (SUTURE) ×4
SUT VIC AB 2-0 CP2 18 (SUTURE) ×4 IMPLANT
SYR 3ML LL SCALE MARK (SYRINGE) ×3 IMPLANT
SYR CONTROL 10ML LL (SYRINGE) ×3 IMPLANT
TAPE CLOTH SURG 4X10 WHT LF (GAUZE/BANDAGES/DRESSINGS) ×1 IMPLANT
TOWEL GREEN STERILE (TOWEL DISPOSABLE) ×2 IMPLANT
TOWEL GREEN STERILE FF (TOWEL DISPOSABLE) ×2 IMPLANT
TRAY FOLEY W/METER SILVER 16FR (SET/KITS/TRAYS/PACK) ×2 IMPLANT
WATER STERILE IRR 1000ML POUR (IV SOLUTION) ×2 IMPLANT

## 2016-12-01 NOTE — Transfer of Care (Signed)
Immediate Anesthesia Transfer of Care Note  Patient: Steve Perez  Procedure(s) Performed: Procedure(s): Lumbar four-five Lumbar decompression, posterior lumbar interbody fusion, posterior lateral arthrodesis (N/A)  Patient Location: PACU  Anesthesia Type:General  Level of Consciousness: awake, alert , oriented and patient cooperative  Airway & Oxygen Therapy: Patient Spontanous Breathing and Patient connected to nasal cannula oxygen  Post-op Assessment: Report given to RN and Post -op Vital signs reviewed and stable  Post vital signs: Reviewed and stable  Last Vitals: There were no vitals filed for this visit.  Last Pain: There were no vitals filed for this visit.       Complications: No apparent anesthesia complications

## 2016-12-01 NOTE — Anesthesia Procedure Notes (Signed)
Procedure Name: Intubation Date/Time: 12/01/2016 7:32 AM Performed by: Rebekah Chesterfield L Pre-anesthesia Checklist: Patient identified, Emergency Drugs available, Suction available and Patient being monitored Patient Re-evaluated:Patient Re-evaluated prior to inductionOxygen Delivery Method: Circle System Utilized Preoxygenation: Pre-oxygenation with 100% oxygen Intubation Type: IV induction Ventilation: Mask ventilation without difficulty Laryngoscope Size: Mac and 4 Grade View: Grade II Tube type: Oral Tube size: 8.0 mm Number of attempts: 1 Airway Equipment and Method: Stylet Placement Confirmation: ETT inserted through vocal cords under direct vision,  positive ETCO2 and breath sounds checked- equal and bilateral Secured at: 22 cm Tube secured with: Tape Dental Injury: Teeth and Oropharynx as per pre-operative assessment

## 2016-12-01 NOTE — Progress Notes (Signed)
Vitals:   12/01/16 1252 12/01/16 1300 12/01/16 1356 12/01/16 1605  BP:  113/65 115/66 121/66  Pulse: 73 69 77 68  Resp: 14 10 18 20   Temp:   98.2 F (36.8 C) 97.8 F (36.6 C)  TempSrc:    Oral  SpO2: 96% 97% 97% 97%  Weight:        Patient resting in bed, is already ambulated in the hallways. Foley removed, but patient has not yet voided. Dressing clean and dry. Good relief of right lumbar radicular pain.  Plan: Doing well following surgery. Encouraged to ambulate. Nursing staff to monitor voiding function.  Hosie Spangle, MD 12/01/2016, 5:42 PM

## 2016-12-01 NOTE — Anesthesia Preprocedure Evaluation (Signed)
Anesthesia Evaluation  Patient identified by MRN, date of birth, ID band Patient awake    Reviewed: Allergy & Precautions, NPO status , Patient's Chart, lab work & pertinent test results  History of Anesthesia Complications Negative for: history of anesthetic complications  Airway Mallampati: II  TM Distance: >3 FB Neck ROM: Full    Dental  (+) Teeth Intact   Pulmonary asthma , Current Smoker,    breath sounds clear to auscultation       Cardiovascular hypertension, Pt. on medications (-) angina(-) Past MI and (-) CHF  Rhythm:Regular     Neuro/Psych Anxiety negative neurological ROS     GI/Hepatic negative GI ROS, Neg liver ROS,   Endo/Other  negative endocrine ROS  Renal/GU negative Renal ROS     Musculoskeletal  (+) Arthritis ,   Abdominal   Peds  Hematology   Anesthesia Other Findings   Reproductive/Obstetrics                             Anesthesia Physical Anesthesia Plan  ASA: II  Anesthesia Plan: General   Post-op Pain Management:    Induction: Intravenous  Airway Management Planned: Oral ETT  Additional Equipment: None  Intra-op Plan:   Post-operative Plan: Extubation in OR  Informed Consent: I have reviewed the patients History and Physical, chart, labs and discussed the procedure including the risks, benefits and alternatives for the proposed anesthesia with the patient or authorized representative who has indicated his/her understanding and acceptance.   Dental advisory given  Plan Discussed with: CRNA and Surgeon  Anesthesia Plan Comments:         Anesthesia Quick Evaluation

## 2016-12-01 NOTE — H&P (Signed)
Subjective: Patient is a 66 y.o. 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 Patient is over years status post right L4-5 lumbar laminotomy micro-discectomy. He's developed recurrent right-sided low back and right lumbar radicular pain. Largest portion of the recurrent disc spaces within the neural foramen next foraminal space there is significant loss of disc space height on the right side and L4-5. His his exam is notable for weakness of the right dorsiflexor and EHL. He is admitted now for bilateral L4-5 lumbar decompression including laminotomies, facetectomies, and foraminotomies and bilateral L4-5 stabilization via a bilateral L4-5 posterior lumbar interbody arthrodesis with interbody implants and bone graft. I'll L4-5 posterior lateral arthrodesis with posterior instrumentation and bone graft.    Patient Active Problem List   Diagnosis Date Noted  . Pseudoarthrosis of cervical spine ([place]) 08/10/2015  . HNP (herniated nucleus pulposus), cervical 11/20/2014   Past Medical History:  Diagnosis Date  . Anxiety    panic attacks  . Arthritis   . Asthma   . History of bronchitis   . Hypertension   . Joint inflammation    thumb, bilaterally    Past Surgical History:  Procedure Laterality Date  . ANTERIOR CERVICAL DECOMP/DISCECTOMY FUSION N/A 11/20/2014   Procedure: ANTERIOR CERVICAL DECOMPRESSION/DISCECTOMY FUSION CERVICAL FOUR-FIVE,CERVICAL FIVE-SIX WITH/HARDWARE REMOVAL  AT CERVICAL FIVE-SIX,CERVICAL SIX-SEVEN.;  Surgeon: Hosie Spangle, MD;  Location: [place] [facility];  Service: Neurosurgery;  Laterality: N/A;  . arthroscopic knee Left   . BACK SURGERY    . CERVICAL FUSION    . COLONOSCOPY    . FRACTURE SURGERY Right    right ankle  . POSTERIOR CERVICAL FUSION/FORAMINOTOMY N/A 08/10/2015   Procedure: CERVICAL FOUR-FIVE, CERVICAL FIVE-SIX POSTERIOR CERVICAL FUSION/FORAMINOTOMY ;   Surgeon: Jovita Gamma, MD;  Location: [place] [facility];  Service: Neurosurgery;  Laterality: N/A;  C4-C6 posterior cervical fusion with instrumentation  . TONSILLECTOMY      Prescriptions Prior to Admission  Medication Sig Dispense Refill Last Dose  . Ascorbic Acid (VITAMIN C PO) Take 500 mg by mouth daily.    2 wks ago  . aspirin EC 81 MG tablet Take 81 mg by mouth daily.   2 wks ago  . atorvastatin (LIPITOR) 20 MG tablet Take 20 mg by mouth daily.  2 11/30/2016 at Unknown time  . B Complex-C (SUPER B COMPLEX PO) Take 1 tablet by mouth daily.   2 wks ago  . lisinopril (PRINIVIL,ZESTRIL) 20 MG tablet Take 20 mg by mouth daily.  2 11/30/2016 at Unknown time  . meloxicam (MOBIC) 15 MG tablet Take 15 mg by mouth daily.  5 2 wks ago  . Multiple Vitamins-Minerals (MULTIVITAMIN PO) Take 1 tablet by mouth daily.   2 wks ago  . Omega-3 Fatty Acids (FISH OIL PO) Take 1 capsule by mouth daily.   2 wks ago  . oxyCODONE-acetaminophen (PERCOCET/ROXICET) 5-325 MG tablet Take 1-2 tablets by mouth every 4 (four) hours as needed for moderate pain. 60 tablet 0   . sertraline (ZOLOFT) 100 MG tablet Take 150 mg by mouth daily.  2 11/30/2016 at Unknown time  . PROVENTIL HFA 108 (90 BASE) MCG/ACT inhaler Inhale 2 puffs into the lungs every 6 (six) hours as needed for wheezing or shortness of breath.   11 Unknown at Unknown time  . QVAR 80 MCG/ACT inhaler Inhale 1 puff into the lungs daily as needed (shortness of breath).   11 Unknown at  Unknown time   Allergies  Allergen Reactions  . No Known Allergies     Social History  Substance Use Topics  . Smoking status: Current Every Day Smoker    Types: Cigars  . Smokeless tobacco: Never Used     Comment: "couple cigars a day"  . Alcohol use Yes     Comment: socially    History reviewed. No pertinent family history.   Review of Systems A comprehensive review of systems was negative.  Objective: Vital signs in last 24 hours: Weight:  [98 kg (216 lb)] 98 kg (216 lb)  (03/08 0548)  EXAM: Patient well-developed well-nourished white male in no acute distress. Lungs are clear to auscultation , the patient has symmetrical respiratory excursion. Heart has a regular rate and rhythm normal S1 and S2 no murmur.   Abdomen is soft nontender nondistended bowel sounds are present. Extremity examination shows no clubbing cyanosis or edema. Neurologic examination shows 5/5 strength in the iliopsoas and quadriceps bilaterally, as well as the left dorsiflexor, EHL, and plantar flexor. However the right with flexor is 4-4+/5, the right EHL is 4 minus to 4/5 in right plantar flexors 5/5. Sensation is intact to pinprick in the distal lower Charlie's. Reflexes examination shows the quadrants to 1 bilateral, left gastrocnemius is minimal to absent, right gastrocnemius is minimal. Toes are downgoing bilaterally. Gait and stance favor the right lower extremity.  Data Review:CBC    Component Value Date/Time   WBC 6.5 11/23/2016 0831   RBC 4.38 11/23/2016 0831   HGB 14.3 11/23/2016 0831   HCT 41.8 11/23/2016 0831   PLT 214 11/23/2016 0831   MCV 95.4 11/23/2016 0831   MCH 32.6 11/23/2016 0831   MCHC 34.2 11/23/2016 0831   RDW 12.8 11/23/2016 0831                          BMET    Component Value Date/Time   NA 136 11/23/2016 0831   K 4.7 11/23/2016 0831   CL 101 11/23/2016 0831   CO2 27 11/23/2016 0831   GLUCOSE 94 11/23/2016 0831   BUN 16 11/23/2016 0831   CREATININE 0.98 11/23/2016 0831   CALCIUM 9.3 11/23/2016 0831   GFRNONAA >60 11/23/2016 0831   GFRAA >60 11/23/2016 0831     Assessment/Plan: Patient with a significant recurrent right L4-5 lumbar disc placed with progressive degeneration at that level is admitted now for an L4-5 lumbar decompression and arthrodesis.  I've discussed with the patient the nature of his condition, the nature the surgical procedure, the typical length of surgery, hospital stay, and overall recuperation, the limitations postoperatively,  and risks of surgery. I discussed risks including risks of infection, bleeding, possibly need for transfusion, the risk of nerve root dysfunction with pain, weakness, numbness, or paresthesias, the risk of dural tear and CSF leakage and possible need for further surgery, the risk of failure of the arthrodesis and possibly for further surgery, the risk of anesthetic complications including myocardial infarction, stroke, pneumonia, and death. We discussed the need for postoperative immobilization in a lumbar brace. Understanding all this the patient does wish to proceed with surgery and is admitted for such.     Hosie Spangle, MD 12/01/2016 7:02 AM

## 2016-12-01 NOTE — Op Note (Addendum)
12/01/2016  1:07 PM  PATIENT:  Steve Perez  66 y.o. 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, lumbar degenerative disc disease, lumbar spondylosis, lumbar radiculopathy  PROCEDURE:  Procedure(s):  Bilateral Lumbar four-five Lumbar decompression including laminotomies, facetectomies, and foraminotomies, for decompression of lateral recess and neural foraminal stenosis with decompression of the thecal sac and exiting L4 and L5 nerve roots; bilateral L4-5 posterior lumbar interbody arthrodesis with AVS peek interbody implants and Vitoss BA with bone marrow aspirate and infuse; bilateral L4-5 posterior lateral arthrodesis with nonsegmental radius posterior instrumentation, locally harvested morcellized autograft, Vitoss BA with bone marrow aspirate, and infuse  SURGEON:  Surgeon(s): Jovita Gamma, MD Kary Kos, MD  ASSISTANTS: Kary Kos, M.D.  ANESTHESIA:   general  EBL:  Total I/O In: 2700 [I.V.:2700] Out: 8676 [Urine:1175; Blood:250]  BLOOD ADMINISTERED:none  CELL SAVER GIVEN: Cell Saver technician felt that there was insufficient blood loss to process the collected blood  COUNT: Correct per nursing staff  DICTATION: Patient is brought to the operating room placed under general endotracheal anesthesia. The patient was turned to prone position the lumbar region was prepped with Betadine soap and solution and draped in a sterile fashion. The midline was infiltrated with local anesthesia with epinephrine. A localizing x-ray was taken and then a midline incision was made, through the previous midline incision, extended both rostrally and caudally. It was carried down through the subcutaneous tissue, bipolar cautery and electrocautery were used to maintain hemostasis. Dissection was carried down to the lumbar  fascia. The fascia was incised bilaterally and the paraspinal muscles were dissected with a spinous process and lamina in a subperiosteal fashion. There was taken to dissect around the scar tissue within the previous right L4-5 laminotomy. Another x-ray was taken for localization and the L4-5 level was localized. Dissection was then carried out laterally over the facet complex and the transverse processes of L4 and L5 were exposed and decorticated.   We then proceeded with decompression. Bilateral L4 and L5 laminotomies were performed the high-speed drill and Kerrison punches. We carefully dissected around the previous right L4-5 laminotomy. Dissection was carried out laterally including facetectomy and foraminotomies with decompression of the stenotic compression of the L4 and L5 nerve roots. Once the decompression stenotic compression of the thecal sac and exiting nerve roots was completed we proceeded with the posterior lumbar interbody arthrodesis. The annulus was incised bilaterally and the disc space entered. A thorough discectomy was performed using pituitary rongeurs and curettes. Particular attention was paid to the significant recurrent right L4-5 lumbar disc herniation. The thecal sac and nerve roots were decompressed. Once the discectomy was completed we began to prepare the endplate surfaces removing the cartilaginous endplates surface. We then measured the height of the intervertebral disc space. We selected 10 x 25 x 4 AVS peek interbody implants.  The C-arm fluoroscope was then draped and brought in the field and we identified the pedicle entry points bilaterally at the L4 and L5 levels. Each of the 4 pedicles was probed, we aspirated bone marrow aspirate from the vertebral bodies, this was injected over a 10 cc and a 5 cc strip of Vitoss BA. Then each of the pedicles was examined with the ball probe good bony surfaces were found and no bony cuts were found. Each of the pedicles was then tapped  with a 5.25 mm tap, again examined with  the ball probe good threading was found and no bony cuts were found. We then placed 5.75 by 45 millimeter screws bilaterally at each level.  We then packed the AVS peek interbody implants with Vitoss BA with bone marrow aspirate and infuse, and then placed the first implant and on the right side, carefully retracting the thecal sac and nerve root medially. We then went back to the left side and packed the midline with additional Vitoss BA with bone marrow aspirate and infuse, and then placed a second implant and on the left side again retracting the thecal sac and nerve root medially. Additional Vitoss BA with bone marrow aspirate and infuse was packed lateral to the implants.  We then packed the lateral gutter over the transverse processes and intertransverse space with locally harvested morcellized autograft, Vitoss BA with bone marrow aspirate, and infuse. We then selected pre-lordosed rods.  We used a 40 mm rod on the left of 35 mm rod on the right.  They were placed within the screw heads and secured with locking caps once all 4 locking caps were placed final tightening was performed against a counter torque.  The wound had been irrigated multiple times during the procedure with saline solution and bacitracin solution, good hemostasis was established with a combination of bipolar cautery and Gelfoam with thrombin. The Gelfoam was removed and a thin layer of Surgifoam applied. Once good hemostasis was confirmed we proceeded with closure paraspinal muscles deep fascia and Scarpa's fascia were closed with interrupted undyed 1 Vicryl sutures the subcutaneous and subcuticular closed with interrupted inverted 2-0 undyed Vicryl sutures the skin edges were approximated with Dermabond. The wound was dressed with sterile gauze and Hypafix.  Following surgery the patient was turned back to the supine position to be reversed and the anesthetic extubated and transferred to the  recovery room for further care.  PLAN OF CARE: Admit to inpatient   PATIENT DISPOSITION:  PACU - hemodynamically stable.   Delay start of Pharmacological VTE agent (>24hrs) due to surgical blood loss or risk of bleeding:  yes

## 2016-12-02 MED ORDER — HYDROCODONE-ACETAMINOPHEN 5-325 MG PO TABS: 1.0000 | ORAL_TABLET | ORAL | 0 refills | Status: DC | PRN

## 2016-12-02 MED FILL — Sodium Chloride IV Soln 0.9%: INTRAVENOUS | Qty: 1000 | Status: AC

## 2016-12-02 MED FILL — Heparin Sodium (Porcine) Inj 1000 Unit/ML: INTRAMUSCULAR | Qty: 30 | Status: AC

## 2016-12-02 NOTE — Progress Notes (Signed)
Pt doing well. Pt and wife given D/C instructions with Rx, verbal understanding was provided. Pt's incision is open to air and is clean and dry. Pt's IV was removed prior to D/C. Pt D/C'd home via walking @ 0945 per MD order. Pt is stable @ D/C and has no other needs at this time. Holli Humbles, RN

## 2016-12-02 NOTE — Discharge Instructions (Signed)

## 2016-12-02 NOTE — Discharge Summary (Signed)
Physician Discharge Summary  Patient ID: Steve Perez MRN: [id number] DOB/AGE: 67/20/1952 67 y.o.  Admit date: 12/01/2016 Discharge date: 12/02/2016  Admission Diagnoses:  Recurrent right L4-L5 lumbar disc herniation, lumbar degenerative disc disease, lumbar spondylosis, lumbar radiculopathy  Discharge Diagnoses:  Recurrent right L4-L5 lumbar disc herniation, lumbar degenerative disc disease, lumbar spondylosis, lumbar radiculopathy Active Problems:   HNP (herniated nucleus pulposus), lumbar   Discharged Condition: good  Hospital Course: Patient was admitted, underwent L4-5 lumbar decompression, PLIF, and PLA. Postoperatively he is done well. He's had excellent relief of his right lumbar radicular pain. He is up and ambulating actively. His dressing was removed, and his wound is healing nicely. There is no erythema, swelling, or drainage. He is being discharged home with instructions regarding wound care and activities. He is to follow-up with me in the office in 3 weeks.  Discharge Exam: Blood pressure 126/74, pulse (!) 59, temperature 98.1 F (36.7 C), temperature source Oral, resp. rate 18, weight 98 kg (216 lb), SpO2 99 %.  Disposition: 01-Home or Self Care  Discharge Instructions    Discharge wound care:    Complete by:  As directed    Leave the wound open to air. Shower daily with the wound uncovered. Water and soapy water should run over the incision area. Do not wash directly on the incision for 2 weeks. Remove the glue after 2 weeks.   Driving Restrictions    Complete by:  As directed    No driving for 2 weeks. May ride in the car locally now. May begin to drive locally in 2 weeks.   Other Restrictions    Complete by:  As directed    Walk gradually increasing distances out in the fresh air at least twice a day. Walking additional 6 times inside the house, gradually increasing distances, daily. No bending, lifting, or twisting. Perform activities between shoulder and waist height  (that is at counter height when standing or table height when sitting).     Allergies as of 12/02/2016      Reactions   No Known Allergies       Medication List    TAKE these medications   aspirin EC 81 MG tablet Take 81 mg by mouth daily.   atorvastatin 20 MG tablet Commonly known as:  LIPITOR Take 20 mg by mouth daily.   FISH OIL PO Take 1 capsule by mouth daily.   HYDROcodone-acetaminophen 5-325 MG tablet Commonly known as:  NORCO/VICODIN Take 1-2 tablets by mouth every 4 (four) hours as needed for moderate pain.   lisinopril 20 MG tablet Commonly known as:  PRINIVIL,ZESTRIL Take 20 mg by mouth daily.   meloxicam 15 MG tablet Commonly known as:  MOBIC Take 15 mg by mouth daily.   MULTIVITAMIN PO Take 1 tablet by mouth daily.   oxyCODONE-acetaminophen 5-325 MG tablet Commonly known as:  PERCOCET/ROXICET Take 1-2 tablets by mouth every 4 (four) hours as needed for moderate pain.   PROVENTIL HFA 108 (90 Base) MCG/ACT inhaler Generic drug:  albuterol Inhale 2 puffs into the lungs every 6 (six) hours as needed for wheezing or shortness of breath.   QVAR 80 MCG/ACT inhaler Generic drug:  beclomethasone Inhale 1 puff into the lungs daily as needed (shortness of breath).   sertraline 100 MG tablet Commonly known as:  ZOLOFT Take 150 mg by mouth daily.   SUPER B COMPLEX PO Take 1 tablet by mouth daily.   VITAMIN C PO Take 500 mg by mouth daily.  SignedHosie Spangle 12/02/2016, 8:11 AM

## 2016-12-05 DIAGNOSIS — M4726 Other spondylosis with radiculopathy, lumbar region: Secondary | ICD-10-CM | POA: Diagnosis not present

## 2016-12-05 DIAGNOSIS — Z981 Arthrodesis status: Secondary | ICD-10-CM | POA: Diagnosis not present

## 2016-12-05 DIAGNOSIS — M5136 Other intervertebral disc degeneration, lumbar region: Secondary | ICD-10-CM | POA: Diagnosis not present

## 2016-12-13 NOTE — Anesthesia Postprocedure Evaluation (Addendum)
Anesthesia Post Note  Patient: Reinaldo Dunkel  Procedure(s) Performed: Procedure(s) (LRB): Lumbar four-five Lumbar decompression, posterior lumbar interbody fusion, posterior lateral arthrodesis (N/A)  Patient location during evaluation: PACU Anesthesia Type: General Level of consciousness: awake and alert Pain management: pain level controlled Vital Signs Assessment: post-procedure vital signs reviewed and stable Respiratory status: spontaneous breathing, nonlabored ventilation, respiratory function stable and patient connected to nasal cannula oxygen Cardiovascular status: blood pressure returned to baseline and stable Postop Assessment: no signs of nausea or vomiting Anesthetic complications: no       Last Vitals:  Vitals:   12/02/16 0444 12/02/16 0855  BP: 126/74 116/62  Pulse: (!) 59 65  Resp: 18 20  Temp: 36.7 C 36.9 C    Last Pain:  Vitals:   12/02/16 0855  TempSrc: Oral  PainSc: 2                  Zoee Heeney

## 2016-12-14 DIAGNOSIS — R69 Illness, unspecified: Secondary | ICD-10-CM | POA: Diagnosis not present

## 2016-12-21 DIAGNOSIS — Z0100 Encounter for examination of eyes and vision without abnormal findings: Secondary | ICD-10-CM | POA: Diagnosis not present

## 2016-12-21 DIAGNOSIS — H5211 Myopia, right eye: Secondary | ICD-10-CM | POA: Diagnosis not present

## 2017-02-27 NOTE — Addendum Note (Signed)
Addendum  created 02/27/17 1213 by Oleta Mouse, MD   Sign clinical note

## 2017-02-28 DIAGNOSIS — Z981 Arthrodesis status: Secondary | ICD-10-CM | POA: Diagnosis not present

## 2017-05-09 DIAGNOSIS — I10 Essential (primary) hypertension: Secondary | ICD-10-CM | POA: Diagnosis not present

## 2017-05-09 DIAGNOSIS — Z981 Arthrodesis status: Secondary | ICD-10-CM | POA: Diagnosis not present

## 2017-05-09 DIAGNOSIS — Z6828 Body mass index (BMI) 28.0-28.9, adult: Secondary | ICD-10-CM | POA: Diagnosis not present

## 2017-05-09 DIAGNOSIS — M4726 Other spondylosis with radiculopathy, lumbar region: Secondary | ICD-10-CM | POA: Diagnosis not present

## 2017-05-09 DIAGNOSIS — M5136 Other intervertebral disc degeneration, lumbar region: Secondary | ICD-10-CM | POA: Diagnosis not present

## 2017-06-21 DIAGNOSIS — R69 Illness, unspecified: Secondary | ICD-10-CM | POA: Diagnosis not present

## 2017-06-26 DIAGNOSIS — M7052 Other bursitis of knee, left knee: Secondary | ICD-10-CM | POA: Diagnosis not present

## 2017-07-04 DIAGNOSIS — R69 Illness, unspecified: Secondary | ICD-10-CM | POA: Diagnosis not present

## 2017-08-10 DIAGNOSIS — G8918 Other acute postprocedural pain: Secondary | ICD-10-CM | POA: Diagnosis not present

## 2017-08-10 DIAGNOSIS — M7042 Prepatellar bursitis, left knee: Secondary | ICD-10-CM | POA: Diagnosis not present

## 2017-09-15 DIAGNOSIS — I10 Essential (primary) hypertension: Secondary | ICD-10-CM | POA: Diagnosis not present

## 2017-09-15 DIAGNOSIS — R7301 Impaired fasting glucose: Secondary | ICD-10-CM | POA: Diagnosis not present

## 2017-09-15 DIAGNOSIS — R82998 Other abnormal findings in urine: Secondary | ICD-10-CM | POA: Diagnosis not present

## 2017-09-15 DIAGNOSIS — Z125 Encounter for screening for malignant neoplasm of prostate: Secondary | ICD-10-CM | POA: Diagnosis not present

## 2017-09-15 DIAGNOSIS — E7849 Other hyperlipidemia: Secondary | ICD-10-CM | POA: Diagnosis not present

## 2017-09-22 DIAGNOSIS — E7849 Other hyperlipidemia: Secondary | ICD-10-CM | POA: Diagnosis not present

## 2017-09-22 DIAGNOSIS — J45909 Unspecified asthma, uncomplicated: Secondary | ICD-10-CM | POA: Diagnosis not present

## 2017-09-22 DIAGNOSIS — I10 Essential (primary) hypertension: Secondary | ICD-10-CM | POA: Diagnosis not present

## 2017-09-22 DIAGNOSIS — Z1389 Encounter for screening for other disorder: Secondary | ICD-10-CM | POA: Diagnosis not present

## 2017-09-22 DIAGNOSIS — R69 Illness, unspecified: Secondary | ICD-10-CM | POA: Diagnosis not present

## 2017-09-22 DIAGNOSIS — Z0000 Encounter for general adult medical examination without abnormal findings: Secondary | ICD-10-CM | POA: Diagnosis not present

## 2017-09-22 DIAGNOSIS — Z6829 Body mass index (BMI) 29.0-29.9, adult: Secondary | ICD-10-CM | POA: Diagnosis not present

## 2017-09-22 DIAGNOSIS — R7301 Impaired fasting glucose: Secondary | ICD-10-CM | POA: Diagnosis not present

## 2017-10-04 DIAGNOSIS — M7042 Prepatellar bursitis, left knee: Secondary | ICD-10-CM | POA: Diagnosis not present

## 2017-10-04 DIAGNOSIS — Z4789 Encounter for other orthopedic aftercare: Secondary | ICD-10-CM | POA: Diagnosis not present

## 2017-10-10 DIAGNOSIS — Z1212 Encounter for screening for malignant neoplasm of rectum: Secondary | ICD-10-CM | POA: Diagnosis not present

## 2017-10-16 DIAGNOSIS — L03116 Cellulitis of left lower limb: Secondary | ICD-10-CM | POA: Diagnosis not present

## 2017-10-17 DIAGNOSIS — M71162 Other infective bursitis, left knee: Secondary | ICD-10-CM | POA: Diagnosis not present

## 2017-10-17 DIAGNOSIS — M7981 Nontraumatic hematoma of soft tissue: Secondary | ICD-10-CM | POA: Diagnosis not present

## 2017-10-17 DIAGNOSIS — Z452 Encounter for adjustment and management of vascular access device: Secondary | ICD-10-CM | POA: Diagnosis not present

## 2017-10-17 DIAGNOSIS — L03116 Cellulitis of left lower limb: Secondary | ICD-10-CM | POA: Diagnosis not present

## 2017-10-17 DIAGNOSIS — E785 Hyperlipidemia, unspecified: Secondary | ICD-10-CM | POA: Diagnosis not present

## 2017-10-17 DIAGNOSIS — Z7901 Long term (current) use of anticoagulants: Secondary | ICD-10-CM | POA: Diagnosis not present

## 2017-10-17 DIAGNOSIS — M25462 Effusion, left knee: Secondary | ICD-10-CM | POA: Diagnosis not present

## 2017-10-17 DIAGNOSIS — M009 Pyogenic arthritis, unspecified: Secondary | ICD-10-CM | POA: Diagnosis not present

## 2017-10-17 DIAGNOSIS — A4101 Sepsis due to Methicillin susceptible Staphylococcus aureus: Secondary | ICD-10-CM | POA: Diagnosis not present

## 2017-10-17 DIAGNOSIS — M79662 Pain in left lower leg: Secondary | ICD-10-CM | POA: Diagnosis not present

## 2017-10-17 DIAGNOSIS — M25562 Pain in left knee: Secondary | ICD-10-CM | POA: Diagnosis not present

## 2017-10-17 DIAGNOSIS — B9561 Methicillin susceptible Staphylococcus aureus infection as the cause of diseases classified elsewhere: Secondary | ICD-10-CM | POA: Diagnosis not present

## 2017-10-17 DIAGNOSIS — Z716 Tobacco abuse counseling: Secondary | ICD-10-CM | POA: Diagnosis not present

## 2017-10-17 DIAGNOSIS — M7989 Other specified soft tissue disorders: Secondary | ICD-10-CM | POA: Diagnosis not present

## 2017-10-17 DIAGNOSIS — R69 Illness, unspecified: Secondary | ICD-10-CM | POA: Diagnosis not present

## 2017-10-17 DIAGNOSIS — M79661 Pain in right lower leg: Secondary | ICD-10-CM | POA: Diagnosis not present

## 2017-10-17 DIAGNOSIS — I10 Essential (primary) hypertension: Secondary | ICD-10-CM | POA: Diagnosis not present

## 2017-10-17 DIAGNOSIS — M71062 Abscess of bursa, left knee: Secondary | ICD-10-CM | POA: Diagnosis not present

## 2017-10-17 DIAGNOSIS — T8144XA Sepsis following a procedure, initial encounter: Secondary | ICD-10-CM | POA: Diagnosis not present

## 2017-10-17 DIAGNOSIS — Z01810 Encounter for preprocedural cardiovascular examination: Secondary | ICD-10-CM | POA: Diagnosis not present

## 2017-10-20 DIAGNOSIS — M71162 Other infective bursitis, left knee: Secondary | ICD-10-CM | POA: Diagnosis not present

## 2017-10-21 DIAGNOSIS — M71162 Other infective bursitis, left knee: Secondary | ICD-10-CM | POA: Diagnosis not present

## 2017-10-22 DIAGNOSIS — M71162 Other infective bursitis, left knee: Secondary | ICD-10-CM | POA: Diagnosis not present

## 2017-10-23 DIAGNOSIS — M71162 Other infective bursitis, left knee: Secondary | ICD-10-CM | POA: Diagnosis not present

## 2017-10-24 DIAGNOSIS — M71162 Other infective bursitis, left knee: Secondary | ICD-10-CM | POA: Diagnosis not present

## 2017-10-25 DIAGNOSIS — M71162 Other infective bursitis, left knee: Secondary | ICD-10-CM | POA: Diagnosis not present

## 2017-10-26 DIAGNOSIS — M71162 Other infective bursitis, left knee: Secondary | ICD-10-CM | POA: Diagnosis not present

## 2017-10-27 DIAGNOSIS — M71162 Other infective bursitis, left knee: Secondary | ICD-10-CM | POA: Diagnosis not present

## 2017-10-28 DIAGNOSIS — M71162 Other infective bursitis, left knee: Secondary | ICD-10-CM | POA: Diagnosis not present

## 2017-10-29 DIAGNOSIS — M71162 Other infective bursitis, left knee: Secondary | ICD-10-CM | POA: Diagnosis not present

## 2017-10-30 DIAGNOSIS — M71162 Other infective bursitis, left knee: Secondary | ICD-10-CM | POA: Diagnosis not present

## 2017-10-31 DIAGNOSIS — M71162 Other infective bursitis, left knee: Secondary | ICD-10-CM | POA: Diagnosis not present

## 2017-11-01 DIAGNOSIS — Z720 Tobacco use: Secondary | ICD-10-CM | POA: Diagnosis not present

## 2017-11-01 DIAGNOSIS — M71162 Other infective bursitis, left knee: Secondary | ICD-10-CM | POA: Diagnosis not present

## 2017-11-01 DIAGNOSIS — Z792 Long term (current) use of antibiotics: Secondary | ICD-10-CM | POA: Diagnosis not present

## 2017-11-01 DIAGNOSIS — Z5181 Encounter for therapeutic drug level monitoring: Secondary | ICD-10-CM | POA: Diagnosis not present

## 2017-11-01 DIAGNOSIS — I10 Essential (primary) hypertension: Secondary | ICD-10-CM | POA: Diagnosis not present

## 2017-11-07 DIAGNOSIS — Z981 Arthrodesis status: Secondary | ICD-10-CM | POA: Diagnosis not present

## 2017-11-07 DIAGNOSIS — Z6829 Body mass index (BMI) 29.0-29.9, adult: Secondary | ICD-10-CM | POA: Diagnosis not present

## 2017-11-07 DIAGNOSIS — M4726 Other spondylosis with radiculopathy, lumbar region: Secondary | ICD-10-CM | POA: Diagnosis not present

## 2017-11-07 DIAGNOSIS — M5136 Other intervertebral disc degeneration, lumbar region: Secondary | ICD-10-CM | POA: Diagnosis not present

## 2017-11-15 DIAGNOSIS — R69 Illness, unspecified: Secondary | ICD-10-CM | POA: Diagnosis not present

## 2017-11-20 DIAGNOSIS — R69 Illness, unspecified: Secondary | ICD-10-CM | POA: Diagnosis not present

## 2017-12-19 DIAGNOSIS — R69 Illness, unspecified: Secondary | ICD-10-CM | POA: Diagnosis not present

## 2018-05-30 DIAGNOSIS — R69 Illness, unspecified: Secondary | ICD-10-CM | POA: Diagnosis not present

## 2018-06-22 DIAGNOSIS — H52223 Regular astigmatism, bilateral: Secondary | ICD-10-CM | POA: Diagnosis not present

## 2018-06-22 DIAGNOSIS — H5201 Hypermetropia, right eye: Secondary | ICD-10-CM | POA: Diagnosis not present

## 2018-06-22 DIAGNOSIS — H25813 Combined forms of age-related cataract, bilateral: Secondary | ICD-10-CM | POA: Diagnosis not present

## 2018-08-10 DIAGNOSIS — H52223 Regular astigmatism, bilateral: Secondary | ICD-10-CM | POA: Diagnosis not present

## 2018-08-10 DIAGNOSIS — H43393 Other vitreous opacities, bilateral: Secondary | ICD-10-CM | POA: Diagnosis not present

## 2018-08-10 DIAGNOSIS — H5212 Myopia, left eye: Secondary | ICD-10-CM | POA: Diagnosis not present

## 2018-08-10 DIAGNOSIS — H5201 Hypermetropia, right eye: Secondary | ICD-10-CM | POA: Diagnosis not present

## 2018-08-20 DIAGNOSIS — R69 Illness, unspecified: Secondary | ICD-10-CM | POA: Diagnosis not present

## 2018-09-25 DIAGNOSIS — L0101 Non-bullous impetigo: Secondary | ICD-10-CM | POA: Diagnosis not present

## 2018-09-25 DIAGNOSIS — L281 Prurigo nodularis: Secondary | ICD-10-CM | POA: Diagnosis not present

## 2018-09-25 DIAGNOSIS — L0889 Other specified local infections of the skin and subcutaneous tissue: Secondary | ICD-10-CM | POA: Diagnosis not present

## 2018-10-16 DIAGNOSIS — I10 Essential (primary) hypertension: Secondary | ICD-10-CM | POA: Diagnosis not present

## 2018-10-16 DIAGNOSIS — R82998 Other abnormal findings in urine: Secondary | ICD-10-CM | POA: Diagnosis not present

## 2018-10-16 DIAGNOSIS — Z125 Encounter for screening for malignant neoplasm of prostate: Secondary | ICD-10-CM | POA: Diagnosis not present

## 2018-10-16 DIAGNOSIS — E7849 Other hyperlipidemia: Secondary | ICD-10-CM | POA: Diagnosis not present

## 2018-10-16 DIAGNOSIS — R7301 Impaired fasting glucose: Secondary | ICD-10-CM | POA: Diagnosis not present

## 2018-10-22 DIAGNOSIS — E7849 Other hyperlipidemia: Secondary | ICD-10-CM | POA: Diagnosis not present

## 2018-10-22 DIAGNOSIS — Z0000 Encounter for general adult medical examination without abnormal findings: Secondary | ICD-10-CM | POA: Diagnosis not present

## 2018-10-22 DIAGNOSIS — M19041 Primary osteoarthritis, right hand: Secondary | ICD-10-CM | POA: Diagnosis not present

## 2018-10-22 DIAGNOSIS — R0989 Other specified symptoms and signs involving the circulatory and respiratory systems: Secondary | ICD-10-CM | POA: Diagnosis not present

## 2018-10-22 DIAGNOSIS — Z1339 Encounter for screening examination for other mental health and behavioral disorders: Secondary | ICD-10-CM | POA: Diagnosis not present

## 2018-10-22 DIAGNOSIS — R69 Illness, unspecified: Secondary | ICD-10-CM | POA: Diagnosis not present

## 2018-10-22 DIAGNOSIS — J45998 Other asthma: Secondary | ICD-10-CM | POA: Diagnosis not present

## 2018-10-22 DIAGNOSIS — M19042 Primary osteoarthritis, left hand: Secondary | ICD-10-CM | POA: Diagnosis not present

## 2018-10-22 DIAGNOSIS — I10 Essential (primary) hypertension: Secondary | ICD-10-CM | POA: Diagnosis not present

## 2018-10-22 DIAGNOSIS — R7301 Impaired fasting glucose: Secondary | ICD-10-CM | POA: Diagnosis not present

## 2018-10-25 ENCOUNTER — Other Ambulatory Visit (HOSPITAL_COMMUNITY): Payer: Self-pay | Admitting: Internal Medicine

## 2018-10-25 ENCOUNTER — Encounter: Payer: Self-pay | Admitting: Family

## 2018-10-25 ENCOUNTER — Inpatient Hospital Stay (HOSPITAL_COMMUNITY): Admission: RE | Admit: 2018-10-25 | Payer: Medicare HMO | Source: Ambulatory Visit

## 2018-10-25 DIAGNOSIS — R0989 Other specified symptoms and signs involving the circulatory and respiratory systems: Secondary | ICD-10-CM

## 2018-11-02 ENCOUNTER — Ambulatory Visit (HOSPITAL_COMMUNITY)
Admission: RE | Admit: 2018-11-02 | Discharge: 2018-11-02 | Disposition: A | Discharge status: Home / Self Care | Payer: Medicare HMO | Source: Ambulatory Visit | Attending: Internal Medicine | Admitting: Internal Medicine

## 2018-11-02 DIAGNOSIS — R0989 Other specified symptoms and signs involving the circulatory and respiratory systems: Secondary | ICD-10-CM | POA: Insufficient documentation

## 2019-02-19 DIAGNOSIS — R69 Illness, unspecified: Secondary | ICD-10-CM | POA: Diagnosis not present

## 2019-05-08 DIAGNOSIS — R69 Illness, unspecified: Secondary | ICD-10-CM | POA: Diagnosis not present

## 2019-05-10 DIAGNOSIS — D225 Melanocytic nevi of trunk: Secondary | ICD-10-CM | POA: Diagnosis not present

## 2019-05-10 DIAGNOSIS — D2221 Melanocytic nevi of right ear and external auricular canal: Secondary | ICD-10-CM | POA: Diagnosis not present

## 2019-05-10 DIAGNOSIS — D485 Neoplasm of uncertain behavior of skin: Secondary | ICD-10-CM | POA: Diagnosis not present

## 2019-05-10 DIAGNOSIS — L818 Other specified disorders of pigmentation: Secondary | ICD-10-CM | POA: Diagnosis not present

## 2019-05-10 DIAGNOSIS — D1809 Hemangioma of other sites: Secondary | ICD-10-CM | POA: Diagnosis not present

## 2019-05-10 DIAGNOSIS — D1801 Hemangioma of skin and subcutaneous tissue: Secondary | ICD-10-CM | POA: Diagnosis not present

## 2019-05-28 DIAGNOSIS — R69 Illness, unspecified: Secondary | ICD-10-CM | POA: Diagnosis not present

## 2019-08-14 DIAGNOSIS — H5212 Myopia, left eye: Secondary | ICD-10-CM | POA: Diagnosis not present

## 2019-08-14 DIAGNOSIS — H04123 Dry eye syndrome of bilateral lacrimal glands: Secondary | ICD-10-CM | POA: Diagnosis not present

## 2019-08-14 DIAGNOSIS — H524 Presbyopia: Secondary | ICD-10-CM | POA: Diagnosis not present

## 2019-08-14 DIAGNOSIS — H43813 Vitreous degeneration, bilateral: Secondary | ICD-10-CM | POA: Diagnosis not present

## 2019-08-14 DIAGNOSIS — H3540 Unspecified peripheral retinal degeneration: Secondary | ICD-10-CM | POA: Diagnosis not present

## 2019-08-14 DIAGNOSIS — H5201 Hypermetropia, right eye: Secondary | ICD-10-CM | POA: Diagnosis not present

## 2019-08-14 DIAGNOSIS — H25813 Combined forms of age-related cataract, bilateral: Secondary | ICD-10-CM | POA: Diagnosis not present

## 2019-08-14 DIAGNOSIS — H52223 Regular astigmatism, bilateral: Secondary | ICD-10-CM | POA: Diagnosis not present

## 2019-08-14 DIAGNOSIS — H11153 Pinguecula, bilateral: Secondary | ICD-10-CM | POA: Diagnosis not present

## 2019-10-03 DIAGNOSIS — R69 Illness, unspecified: Secondary | ICD-10-CM | POA: Diagnosis not present

## 2019-10-08 DIAGNOSIS — R292 Abnormal reflex: Secondary | ICD-10-CM | POA: Diagnosis not present

## 2019-10-08 DIAGNOSIS — M542 Cervicalgia: Secondary | ICD-10-CM | POA: Diagnosis not present

## 2019-10-08 DIAGNOSIS — R258 Other abnormal involuntary movements: Secondary | ICD-10-CM | POA: Diagnosis not present

## 2019-10-08 DIAGNOSIS — M4722 Other spondylosis with radiculopathy, cervical region: Secondary | ICD-10-CM | POA: Diagnosis not present

## 2019-10-08 DIAGNOSIS — M5412 Radiculopathy, cervical region: Secondary | ICD-10-CM | POA: Diagnosis not present

## 2019-10-10 DIAGNOSIS — M47812 Spondylosis without myelopathy or radiculopathy, cervical region: Secondary | ICD-10-CM | POA: Diagnosis not present

## 2019-10-10 DIAGNOSIS — M4803 Spinal stenosis, cervicothoracic region: Secondary | ICD-10-CM | POA: Diagnosis not present

## 2019-10-10 DIAGNOSIS — M5023 Other cervical disc displacement, cervicothoracic region: Secondary | ICD-10-CM | POA: Diagnosis not present

## 2019-10-10 DIAGNOSIS — R258 Other abnormal involuntary movements: Secondary | ICD-10-CM | POA: Diagnosis not present

## 2019-10-11 ENCOUNTER — Other Ambulatory Visit: Payer: Self-pay | Admitting: Neurosurgery

## 2019-10-11 DIAGNOSIS — M5412 Radiculopathy, cervical region: Secondary | ICD-10-CM | POA: Diagnosis not present

## 2019-10-11 DIAGNOSIS — M5020 Other cervical disc displacement, unspecified cervical region: Secondary | ICD-10-CM | POA: Diagnosis not present

## 2019-10-11 DIAGNOSIS — M4722 Other spondylosis with radiculopathy, cervical region: Secondary | ICD-10-CM | POA: Diagnosis not present

## 2019-10-11 DIAGNOSIS — M5030 Other cervical disc degeneration, unspecified cervical region: Secondary | ICD-10-CM | POA: Diagnosis not present

## 2019-10-11 DIAGNOSIS — R29898 Other symptoms and signs involving the musculoskeletal system: Secondary | ICD-10-CM | POA: Diagnosis not present

## 2019-10-18 NOTE — Progress Notes (Signed)
[id number] - [place], [address] [place] [address] Phone: [phone] Fax: [phone]      Your procedure is scheduled on October 23, 2019.  Report to [facility] Main Entrance "A" at 5:30 A.M., and check in at the Admitting office.  Call this number if you have problems the morning of surgery:  [phone]  Call [phone] if you have any questions prior to your surgery date Monday-Friday 8am-4pm    Remember:  Do not eat or drink after midnight the night before your surgery   Take these medicines the morning of surgery with A SIP OF WATER: atorvastatin (LIPITOR) sertraline (ZOLOFT)  PROVENTIL HFA (Inhaler) - as needed HYDROcodone-acetaminophen (NORCO/VICODIN) - as needed  As of today, STOP taking any Aspirin (unless otherwise instructed by your surgeon), Aleve, Naproxen, Ibuprofen, Motrin, Advil, Goody's, BC's, all herbal medications, fish oil, and all vitamins.    The Morning of Surgery  Do not wear jewelry.  Do not wear lotions, powders, colognes, or deodorant    Men may shave face and neck.  Do not bring valuables to the hospital.  [facility] is not responsible for any belongings or valuables.  If you are a smoker, DO NOT Smoke 24 hours prior to surgery  If you wear a CPAP at night please bring your mask the morning of surgery   Remember that you must have someone to transport you home after your surgery, and remain with you for 24 hours if you are discharged the same day.   Please bring cases for contacts, glasses, hearing aids, dentures or bridgework because it cannot be worn into surgery.    Leave your suitcase in the car.  After surgery it may be brought to your room.  For patients admitted to the hospital, discharge time will be determined by your treatment team.  Patients discharged the day of surgery will not be allowed to drive home.    Special instructions:    [facility]- Preparing For Surgery  Before surgery, you can play an important role. Because skin is not sterile, your skin needs to be as free of germs as possible. You can reduce the number of germs on your skin by washing with CHG (chlorahexidine gluconate) Soap before surgery.  CHG is an antiseptic cleaner which kills germs and bonds with the skin to continue killing germs even after washing.    Oral Hygiene is also important to reduce your risk of infection.  Remember - BRUSH YOUR TEETH THE MORNING OF SURGERY WITH YOUR REGULAR TOOTHPASTE  Please do not use if you have an allergy to CHG or antibacterial soaps. If your skin becomes reddened/irritated stop using the CHG.  Do not shave (including legs and underarms) for at least 48 hours prior to first CHG shower. It is OK to shave your face.  Please follow these instructions carefully.   1. Shower the NIGHT BEFORE SURGERY and the MORNING OF SURGERY with CHG Soap.   2. If you chose to wash your hair, wash your hair first as usual with your normal shampoo.  3. After you shampoo, rinse your hair and body thoroughly to remove the shampoo.  4. Use CHG as you would any other liquid soap. You can apply CHG directly to the skin and wash gently with a scrungie or a clean washcloth.   5. Apply the CHG Soap to your body ONLY FROM THE NECK DOWN.  Do not use on  open wounds or open sores. Avoid contact with your eyes, ears, mouth and genitals (private parts). Wash Face and genitals (private parts)  with your normal soap.   6. Wash thoroughly, paying special attention to the area where your surgery will be performed.  7. Thoroughly rinse your body with warm water from the neck down.  8. DO NOT shower/wash with your normal soap after using and rinsing off the CHG Soap.  9. Pat yourself dry with a CLEAN TOWEL.  10. Wear CLEAN PAJAMAS to bed the night before surgery, wear comfortable clothes the morning of surgery  11. Place CLEAN SHEETS on your bed  the night of your first shower and DO NOT SLEEP WITH PETS.    Day of Surgery:  Please shower the morning of surgery with the CHG soap Do not apply any deodorants/lotions. Please wear clean clothes to the hospital/surgery center.   Remember to brush your teeth WITH YOUR REGULAR TOOTHPASTE.   Please read over the following fact sheets that you were given.

## 2019-10-21 ENCOUNTER — Other Ambulatory Visit: Payer: Self-pay

## 2019-10-21 ENCOUNTER — Encounter (HOSPITAL_COMMUNITY): Payer: Self-pay

## 2019-10-21 ENCOUNTER — Encounter (HOSPITAL_COMMUNITY)
Admission: RE | Admit: 2019-10-21 | Discharge: 2019-10-21 | Disposition: A | Discharge status: Home / Self Care | Payer: Medicare HMO | Source: Ambulatory Visit | Attending: Neurosurgery | Admitting: Neurosurgery

## 2019-10-21 ENCOUNTER — Other Ambulatory Visit (HOSPITAL_COMMUNITY)
Admission: RE | Admit: 2019-10-21 | Discharge: 2019-10-21 | Disposition: A | Discharge status: Home / Self Care | Payer: Medicare HMO | Source: Ambulatory Visit | Attending: Neurosurgery | Admitting: Neurosurgery

## 2019-10-21 DIAGNOSIS — Z01818 Encounter for other preprocedural examination: Secondary | ICD-10-CM | POA: Insufficient documentation

## 2019-10-21 DIAGNOSIS — R69 Illness, unspecified: Secondary | ICD-10-CM | POA: Diagnosis not present

## 2019-10-21 DIAGNOSIS — M5020 Other cervical disc displacement, unspecified cervical region: Secondary | ICD-10-CM | POA: Diagnosis not present

## 2019-10-21 DIAGNOSIS — Z791 Long term (current) use of non-steroidal anti-inflammatories (NSAID): Secondary | ICD-10-CM | POA: Diagnosis not present

## 2019-10-21 DIAGNOSIS — F410 Panic disorder [episodic paroxysmal anxiety] without agoraphobia: Secondary | ICD-10-CM | POA: Diagnosis not present

## 2019-10-21 DIAGNOSIS — I4510 Unspecified right bundle-branch block: Secondary | ICD-10-CM | POA: Insufficient documentation

## 2019-10-21 DIAGNOSIS — J45909 Unspecified asthma, uncomplicated: Secondary | ICD-10-CM | POA: Diagnosis not present

## 2019-10-21 DIAGNOSIS — Z981 Arthrodesis status: Secondary | ICD-10-CM | POA: Diagnosis not present

## 2019-10-21 DIAGNOSIS — Z7982 Long term (current) use of aspirin: Secondary | ICD-10-CM | POA: Diagnosis not present

## 2019-10-21 DIAGNOSIS — Z87891 Personal history of nicotine dependence: Secondary | ICD-10-CM | POA: Insufficient documentation

## 2019-10-21 DIAGNOSIS — Z79899 Other long term (current) drug therapy: Secondary | ICD-10-CM | POA: Diagnosis not present

## 2019-10-21 DIAGNOSIS — I10 Essential (primary) hypertension: Secondary | ICD-10-CM | POA: Diagnosis not present

## 2019-10-21 LAB — COMPREHENSIVE METABOLIC PANEL
ALT: 27 U/L (ref 0–44)
AST: 25 U/L (ref 15–41)
Albumin: 4 g/dL (ref 3.5–5.0)
Alkaline Phosphatase: 30 U/L — ABNORMAL LOW (ref 38–126)
Anion gap: 7 (ref 5–15)
BUN: 23 mg/dL (ref 8–23)
CO2: 27 mmol/L (ref 22–32)
Calcium: 9.3 mg/dL (ref 8.9–10.3)
Chloride: 105 mmol/L (ref 98–111)
Creatinine, Ser: 0.98 mg/dL (ref 0.61–1.24)
GFR calc Af Amer: >60 mL/min (ref >60)
GFR calc non Af Amer: >60 mL/min (ref >60)
Glucose, Bld: 109 mg/dL — ABNORMAL HIGH (ref 70–99)
Potassium: 4.8 mmol/L (ref 3.5–5.1)
Sodium: 139 mmol/L (ref 135–145)
Total Bilirubin: 0.4 mg/dL (ref 0.3–1.2)
Total Protein: 5.6 g/dL — ABNORMAL LOW (ref 6.5–8.1)

## 2019-10-21 LAB — TYPE AND SCREEN
ABO/RH(D): A POS
Antibody Screen: NEGATIVE

## 2019-10-21 LAB — SARS CORONAVIRUS 2 (TAT 6-24 HRS): SARS Coronavirus 2: NEGATIVE

## 2019-10-21 LAB — CBC
HCT: 39.9 % (ref 39.0–52.0)
Hemoglobin: 13.2 g/dL (ref 13.0–17.0)
MCH: 33.5 pg (ref 26.0–34.0)
MCHC: 33.1 g/dL (ref 30.0–36.0)
MCV: 101.3 fL — ABNORMAL HIGH (ref 80.0–100.0)
Platelets: 203 10*3/uL (ref 150–400)
RBC: 3.94 MIL/uL — ABNORMAL LOW (ref 4.22–5.81)
RDW: 12.1 % (ref 11.5–15.5)
WBC: 5.6 10*3/uL (ref 4.0–10.5)
nRBC: 0 % (ref 0.0–0.2)

## 2019-10-21 LAB — SURGICAL PCR SCREEN
MRSA, PCR: NEGATIVE
Staphylococcus aureus: NEGATIVE

## 2019-10-21 NOTE — Progress Notes (Addendum)
PCP - Marton Redwood, MD Cardiologist - pt denies   Chest x-ray - n/a EKG - 10/21/19 Stress Test - pt denies ECHO - pt denies Cardiac Cath - pt denies   Aspirin Instructions: per surgeon orders, pt last dose 10/11/19   COVID TEST- 10/21/19   Anesthesia review: yes abnormal EKG  Patient denies shortness of breath, fever, cough and chest pain at PAT appointment   All instructions explained to the patient, with a verbal understanding of the material. Patient agrees to go over the instructions while at home for a better understanding. Patient also instructed to self quarantine after being tested for COVID-19. The opportunity to ask questions was provided.

## 2019-10-22 ENCOUNTER — Other Ambulatory Visit: Payer: Self-pay | Admitting: Neurosurgery

## 2019-10-22 NOTE — Progress Notes (Signed)
Anesthesia Chart Review:  Case: [id number] Date/Time: 10/23/19 0715   Procedure: ANTERIOR CERVICAL DECOMPRESSION/DISCECTOMY FUSION  CERVICAL 7- THORACIC 1 (N/A ) - ANTERIOR CERVICAL DECOMPRESSION/DISCECTOMY FUSION  CERVICAL 7- THORACIC 1   Anesthesia type: General   Pre-op diagnosis: HERNIATED NUCLEUS PULPOSUS, CERVICAL   Location: [facility] ROOM 19 / [facility]   Surgeons: Jovita Gamma, MD      DISCUSSION: Patient is a 69 year old male scheduled for the above procedure.  History includes smoking, HTN, anxiety with panic attacks, asthma, neck surgeries (C5-7 ACDF 09/11/06; removal C5-7 plate, C4-5 ACDF, revision of C5-6 arthrodesis 11/20/14; C4-6 posterior fusion 08/10/15), back surgery (s/p L4-5 arthrodesis 12/01/16).  Case posted as priority 1. Last ASA 10/11/19.  He denied shortness of breath, cough, fever, chest pain at PAT RN visit.  10/21/2019 presurgical COVID-19 test negative.  Anesthesia team to evaluate on the day of surgery.   VS: BP (!) 173/81   Pulse 64   Temp 36.9 C (Oral)   Resp 18   Ht 6' (1.829 m)   Wt 101.1 kg   SpO2 99%   BMI 30.23 kg/m   PROVIDERS: Marton Redwood, MD his PCP   LABS: Labs reviewed: Acceptable for surgery. (all labs ordered are listed, but only abnormal results are displayed)  Labs Reviewed  CBC - Abnormal; Notable for the following components:      Result Value   RBC 3.94 (*)    MCV 101.3 (*)    All other components within normal limits  COMPREHENSIVE METABOLIC PANEL - Abnormal; Notable for the following components:   Glucose, Bld 109 (*)    Total Protein 5.6 (*)    Alkaline Phosphatase 30 (*)    All other components within normal limits  SURGICAL PCR SCREEN  TYPE AND SCREEN     EKG: 10/21/19: Sinus rhythm 57 bpm Incomplete right bundle branch block Borderline ECG Compared to previous tracing , IRBBB is new. Confirmed by Vernell Leep [phone]) on 10/21/2019 5:43:44 PM   CV: Carotid US 11/02/18: Summary: Right Carotid: Velocities in the  right ICA are consistent with a 1-39% stenosis.                Right vertebral is considerably smaller than the left. Left Carotid: Velocities in the left ICA are consistent with a 1-39% stenosis.               Left ICA is very tortuous. Vertebrals:  Bilateral vertebral arteries demonstrate antegrade flow. Subclavians: Normal flow hemodynamics were seen in bilateral subclavian              arteries.   Past Medical History:  Diagnosis Date  . Anxiety    panic attacks  . Arthritis   . Asthma   . History of bronchitis   . Hypertension   . Joint inflammation    thumb, bilaterally    Past Surgical History:  Procedure Laterality Date  . ANTERIOR CERVICAL DECOMP/DISCECTOMY FUSION N/A 11/20/2014   Procedure: ANTERIOR CERVICAL DECOMPRESSION/DISCECTOMY FUSION CERVICAL FOUR-FIVE,CERVICAL FIVE-SIX WITH/HARDWARE REMOVAL  AT CERVICAL FIVE-SIX,CERVICAL SIX-SEVEN.;  Surgeon: Hosie Spangle, MD;  Location: [facility];  Service: Neurosurgery;  Laterality: N/A;  . arthroscopic knee Left   . BACK SURGERY    . CERVICAL FUSION    . COLONOSCOPY    . FRACTURE SURGERY Right    right ankle  . POSTERIOR CERVICAL FUSION/FORAMINOTOMY N/A 08/10/2015   Procedure: CERVICAL FOUR-FIVE, CERVICAL FIVE-SIX POSTERIOR CERVICAL FUSION/FORAMINOTOMY ;  Surgeon: Jovita Gamma, MD;  Location:  [facility];  Service: Neurosurgery;  Laterality: N/A;  C4-C6 posterior cervical fusion with instrumentation  . TONSILLECTOMY      MEDICATIONS: . Ascorbic Acid (VITAMIN C PO)  . aspirin EC 81 MG tablet  . atorvastatin (LIPITOR) 20 MG tablet  . Cholecalciferol (VITAMIN D) 50 MCG (2000 UT) CAPS  . HYDROcodone-acetaminophen (NORCO/VICODIN) 5-325 MG tablet  . lisinopril (PRINIVIL,ZESTRIL) 20 MG tablet  . meloxicam (MOBIC) 15 MG tablet  . Multiple Vitamins-Minerals (MULTIVITAMIN PO)  . Omega-3 Fatty Acids (FISH OIL) 1200 MG CAPS  . oxyCODONE-acetaminophen (PERCOCET/ROXICET) 5-325 MG tablet  . PROVENTIL HFA 108 (90 BASE)  MCG/ACT inhaler  . sertraline (ZOLOFT) 100 MG tablet  . vitamin B-12 (CYANOCOBALAMIN) 1000 MCG tablet   No current facility-administered medications for this encounter.    Myra Gianotti, PA-C Surgical Short Stay/Anesthesiology [facility] Phone [phone] [facility] Phone [phone] 10/22/2019 9:46 AM

## 2019-10-22 NOTE — Anesthesia Preprocedure Evaluation (Addendum)
Anesthesia Evaluation  Patient identified by MRN, date of birth, ID band Patient awake    Reviewed: Allergy & Precautions, NPO status , Patient's Chart, lab work & pertinent test results  History of Anesthesia Complications Negative for: history of anesthetic complications  Airway Mallampati: I  TM Distance: >3 FB Neck ROM: Full    Dental  (+) Caps, Dental Advisory Given   Pulmonary COPD,  COPD inhaler, Current Smoker and Patient abstained from smoking.,  10/21/2019 SARS coronavirus NEG   breath sounds clear to auscultation       Cardiovascular hypertension, Pt. on medications (-) angina Rhythm:Regular Rate:Normal     Neuro/Psych Anxiety Chronic neck/ back pain: narcotics    GI/Hepatic negative GI ROS, Neg liver ROS,   Endo/Other  negative endocrine ROS  Renal/GU negative Renal ROS     Musculoskeletal  (+) Arthritis ,   Abdominal   Peds  Hematology negative hematology ROS (+)   Anesthesia Other Findings   Reproductive/Obstetrics                            Anesthesia Physical Anesthesia Plan  ASA: II  Anesthesia Plan: General   Post-op Pain Management:    Induction: Intravenous  PONV Risk Score and Plan: 1 and Ondansetron and Dexamethasone  Airway Management Planned: Oral ETT and Video Laryngoscope Planned  Additional Equipment:   Intra-op Plan:   Post-operative Plan: Extubation in OR  Informed Consent: I have reviewed the patients History and Physical, chart, labs and discussed the procedure including the risks, benefits and alternatives for the proposed anesthesia with the patient or authorized representative who has indicated his/her understanding and acceptance.     Dental advisory given  Plan Discussed with: CRNA and Surgeon  Anesthesia Plan Comments: (PAT note written 10/22/2019 by Myra Gianotti, PA-C. )      Anesthesia Quick Evaluation

## 2019-10-23 ENCOUNTER — Encounter (HOSPITAL_COMMUNITY)
Admission: RE | Disposition: A | Discharge status: Home / Self Care | Payer: Self-pay | Source: Home / Self Care | Attending: Neurosurgery

## 2019-10-23 ENCOUNTER — Other Ambulatory Visit: Payer: Self-pay

## 2019-10-23 ENCOUNTER — Ambulatory Visit (HOSPITAL_COMMUNITY): Payer: Medicare HMO | Admitting: Physician Assistant

## 2019-10-23 ENCOUNTER — Ambulatory Visit (HOSPITAL_COMMUNITY): Payer: Medicare HMO

## 2019-10-23 ENCOUNTER — Observation Stay (HOSPITAL_COMMUNITY)
Admission: RE | Admit: 2019-10-23 | Discharge: 2019-10-23 | Disposition: A | Discharge status: Home / Self Care | Payer: Medicare HMO | Attending: Neurosurgery | Admitting: Neurosurgery

## 2019-10-23 ENCOUNTER — Encounter (HOSPITAL_COMMUNITY): Payer: Self-pay | Admitting: Neurosurgery

## 2019-10-23 DIAGNOSIS — M4722 Other spondylosis with radiculopathy, cervical region: Secondary | ICD-10-CM | POA: Diagnosis not present

## 2019-10-23 DIAGNOSIS — Z791 Long term (current) use of non-steroidal anti-inflammatories (NSAID): Secondary | ICD-10-CM | POA: Insufficient documentation

## 2019-10-23 DIAGNOSIS — M5020 Other cervical disc displacement, unspecified cervical region: Secondary | ICD-10-CM | POA: Diagnosis present

## 2019-10-23 DIAGNOSIS — Z7982 Long term (current) use of aspirin: Secondary | ICD-10-CM | POA: Diagnosis not present

## 2019-10-23 DIAGNOSIS — F17290 Nicotine dependence, other tobacco product, uncomplicated: Secondary | ICD-10-CM | POA: Diagnosis not present

## 2019-10-23 DIAGNOSIS — I10 Essential (primary) hypertension: Secondary | ICD-10-CM | POA: Insufficient documentation

## 2019-10-23 DIAGNOSIS — F419 Anxiety disorder, unspecified: Secondary | ICD-10-CM | POA: Diagnosis not present

## 2019-10-23 DIAGNOSIS — M5013 Cervical disc disorder with radiculopathy, cervicothoracic region: Secondary | ICD-10-CM | POA: Diagnosis not present

## 2019-10-23 DIAGNOSIS — Z79899 Other long term (current) drug therapy: Secondary | ICD-10-CM | POA: Insufficient documentation

## 2019-10-23 DIAGNOSIS — Z419 Encounter for procedure for purposes other than remedying health state, unspecified: Secondary | ICD-10-CM

## 2019-10-23 DIAGNOSIS — M1990 Unspecified osteoarthritis, unspecified site: Secondary | ICD-10-CM | POA: Insufficient documentation

## 2019-10-23 DIAGNOSIS — M2578 Osteophyte, vertebrae: Secondary | ICD-10-CM | POA: Diagnosis not present

## 2019-10-23 DIAGNOSIS — R69 Illness, unspecified: Secondary | ICD-10-CM | POA: Diagnosis not present

## 2019-10-23 DIAGNOSIS — Z981 Arthrodesis status: Secondary | ICD-10-CM | POA: Insufficient documentation

## 2019-10-23 DIAGNOSIS — M960 Pseudarthrosis after fusion or arthrodesis: Secondary | ICD-10-CM | POA: Diagnosis not present

## 2019-10-23 DIAGNOSIS — J449 Chronic obstructive pulmonary disease, unspecified: Secondary | ICD-10-CM | POA: Diagnosis not present

## 2019-10-23 DIAGNOSIS — M4322 Fusion of spine, cervical region: Secondary | ICD-10-CM | POA: Diagnosis not present

## 2019-10-23 DIAGNOSIS — M5023 Other cervical disc displacement, cervicothoracic region: Secondary | ICD-10-CM | POA: Diagnosis not present

## 2019-10-23 HISTORY — PX: ANTERIOR CERVICAL DECOMP/DISCECTOMY FUSION: SHX1161

## 2019-10-23 SURGERY — ANTERIOR CERVICAL DECOMPRESSION/DISCECTOMY FUSION 1 LEVEL
Anesthesia: General | Site: Spine Cervical

## 2019-10-23 MED ORDER — KETOROLAC TROMETHAMINE 30 MG/ML IJ SOLN: 15.0000 mg | Freq: Four times a day (QID) | INTRAMUSCULAR | Status: DC

## 2019-10-23 MED ORDER — MIDAZOLAM HCL 2 MG/2ML IJ SOLN
INTRAMUSCULAR | Status: AC
  Filled 2019-10-23: qty 2

## 2019-10-23 MED ORDER — SODIUM CHLORIDE 0.9 % IV SOLN
INTRAVENOUS | Status: DC | PRN
  Administered 2019-10-23: 500 mL

## 2019-10-23 MED ORDER — FENTANYL CITRATE (PF) 250 MCG/5ML IJ SOLN
INTRAMUSCULAR | Status: AC
  Filled 2019-10-23: qty 5

## 2019-10-23 MED ORDER — MENTHOL 3 MG MT LOZG: 1.0000 | LOZENGE | OROMUCOSAL | Status: DC | PRN

## 2019-10-23 MED ORDER — DEXTROSE-NACL 5-0.45 % IV SOLN: INTRAVENOUS | Status: DC

## 2019-10-23 MED ORDER — CYCLOBENZAPRINE HCL 5 MG PO TABS: 5.0000 mg | ORAL_TABLET | Freq: Three times a day (TID) | ORAL | Status: DC | PRN

## 2019-10-23 MED ORDER — LIDOCAINE 2% (20 MG/ML) 5 ML SYRINGE
INTRAMUSCULAR | Status: DC | PRN
  Administered 2019-10-23: 20 mg via INTRAVENOUS

## 2019-10-23 MED ORDER — THROMBIN 5000 UNITS EX SOLR
OROMUCOSAL | Status: DC | PRN
  Administered 2019-10-23: 5 mL via TOPICAL

## 2019-10-23 MED ORDER — ACETAMINOPHEN 10 MG/ML IV SOLN
INTRAVENOUS | Status: AC
  Filled 2019-10-23: qty 100

## 2019-10-23 MED ORDER — ONDANSETRON HCL 4 MG PO TABS: 4.0000 mg | ORAL_TABLET | Freq: Four times a day (QID) | ORAL | Status: DC | PRN

## 2019-10-23 MED ORDER — ATORVASTATIN CALCIUM 10 MG PO TABS: 20.0000 mg | ORAL_TABLET | Freq: Every day | ORAL | Status: DC

## 2019-10-23 MED ORDER — CEFAZOLIN SODIUM-DEXTROSE 2-4 GM/100ML-% IV SOLN
2.0000 g | INTRAVENOUS | Status: AC
  Administered 2019-10-23: 08:00:00 1 g via INTRAVENOUS
  Filled 2019-10-23: qty 100

## 2019-10-23 MED ORDER — ACETAMINOPHEN 10 MG/ML IV SOLN
INTRAVENOUS | Status: DC | PRN
  Administered 2019-10-23: 1000 mg via INTRAVENOUS

## 2019-10-23 MED ORDER — BUPIVACAINE HCL (PF) 0.5 % IJ SOLN
INTRAMUSCULAR | Status: AC
  Filled 2019-10-23: qty 30

## 2019-10-23 MED ORDER — LIDOCAINE-EPINEPHRINE 1 %-1:100000 IJ SOLN
INTRAMUSCULAR | Status: AC
  Filled 2019-10-23: qty 1

## 2019-10-23 MED ORDER — ESMOLOL HCL 100 MG/10ML IV SOLN
INTRAVENOUS | Status: DC | PRN
  Administered 2019-10-23: 20 mg via INTRAVENOUS
  Administered 2019-10-23: 40 mg via INTRAVENOUS

## 2019-10-23 MED ORDER — SODIUM CHLORIDE 0.9 % IV SOLN: 250.0000 mL | INTRAVENOUS | Status: DC

## 2019-10-23 MED ORDER — PROMETHAZINE HCL 25 MG/ML IJ SOLN: 6.2500 mg | INTRAMUSCULAR | Status: DC | PRN

## 2019-10-23 MED ORDER — THROMBIN 5000 UNITS EX SOLR
CUTANEOUS | Status: DC | PRN
  Administered 2019-10-23 (×2): 5000 [IU] via TOPICAL

## 2019-10-23 MED ORDER — 0.9 % SODIUM CHLORIDE (POUR BTL) OPTIME
TOPICAL | Status: DC | PRN
  Administered 2019-10-23: 1000 mL

## 2019-10-23 MED ORDER — VITAMIN B-12 1000 MCG PO TABS: 1000.0000 ug | ORAL_TABLET | Freq: Every day | ORAL | Status: DC

## 2019-10-23 MED ORDER — PROPOFOL 10 MG/ML IV BOLUS
INTRAVENOUS | Status: AC
  Filled 2019-10-23: qty 40

## 2019-10-23 MED ORDER — ALUM & MAG HYDROXIDE-SIMETH 200-200-20 MG/5ML PO SUSP: 30.0000 mL | Freq: Four times a day (QID) | ORAL | Status: DC | PRN

## 2019-10-23 MED ORDER — LISINOPRIL 20 MG PO TABS: 20.0000 mg | ORAL_TABLET | Freq: Every day | ORAL | Status: DC

## 2019-10-23 MED ORDER — MIDAZOLAM HCL 2 MG/2ML IJ SOLN: 0.5000 mg | Freq: Once | INTRAMUSCULAR | Status: DC | PRN

## 2019-10-23 MED ORDER — ONDANSETRON HCL 4 MG/2ML IJ SOLN
INTRAMUSCULAR | Status: AC
  Filled 2019-10-23: qty 2

## 2019-10-23 MED ORDER — FLEET ENEMA 7-19 GM/118ML RE ENEM: 1.0000 | ENEMA | Freq: Once | RECTAL | Status: DC | PRN

## 2019-10-23 MED ORDER — THROMBIN 5000 UNITS EX SOLR
CUTANEOUS | Status: AC
  Filled 2019-10-23: qty 5000

## 2019-10-23 MED ORDER — LACTATED RINGERS IV SOLN: INTRAVENOUS | Status: DC | PRN

## 2019-10-23 MED ORDER — HYDROCODONE-ACETAMINOPHEN 5-325 MG PO TABS: 1.0000 | ORAL_TABLET | Freq: Four times a day (QID) | ORAL | 0 refills | Status: DC | PRN

## 2019-10-23 MED ORDER — FENTANYL CITRATE (PF) 250 MCG/5ML IJ SOLN
INTRAMUSCULAR | Status: DC | PRN
  Administered 2019-10-23 (×5): 50 ug via INTRAVENOUS

## 2019-10-23 MED ORDER — SODIUM CHLORIDE 0.9% FLUSH: 3.0000 mL | INTRAVENOUS | Status: DC | PRN

## 2019-10-23 MED ORDER — BISACODYL 10 MG RE SUPP: 10.0000 mg | Freq: Every day | RECTAL | Status: DC | PRN

## 2019-10-23 MED ORDER — LIDOCAINE-EPINEPHRINE 1 %-1:100000 IJ SOLN
INTRAMUSCULAR | Status: DC | PRN
  Administered 2019-10-23: 4.7 mL

## 2019-10-23 MED ORDER — SUCCINYLCHOLINE CHLORIDE 200 MG/10ML IV SOSY
PREFILLED_SYRINGE | INTRAVENOUS | Status: AC
  Filled 2019-10-23: qty 10

## 2019-10-23 MED ORDER — BUPIVACAINE HCL (PF) 0.5 % IJ SOLN
INTRAMUSCULAR | Status: DC | PRN
  Administered 2019-10-23: 4.7 mL

## 2019-10-23 MED ORDER — EPHEDRINE SULFATE-NACL 50-0.9 MG/10ML-% IV SOSY
PREFILLED_SYRINGE | INTRAVENOUS | Status: DC | PRN
  Administered 2019-10-23: 10 mg via INTRAVENOUS
  Administered 2019-10-23: 5 mg via INTRAVENOUS

## 2019-10-23 MED ORDER — KETOROLAC TROMETHAMINE 15 MG/ML IJ SOLN
INTRAMUSCULAR | Status: AC
  Filled 2019-10-23: qty 1

## 2019-10-23 MED ORDER — CHLORHEXIDINE GLUCONATE CLOTH 2 % EX PADS: 6.0000 | MEDICATED_PAD | Freq: Once | CUTANEOUS | Status: DC

## 2019-10-23 MED ORDER — DEXAMETHASONE SODIUM PHOSPHATE 10 MG/ML IJ SOLN
INTRAMUSCULAR | Status: AC
  Filled 2019-10-23: qty 1

## 2019-10-23 MED ORDER — VITAMIN D 50 MCG (2000 UT) PO CAPS: 2000.0000 [IU] | ORAL_CAPSULE | Freq: Every day | ORAL | Status: DC

## 2019-10-23 MED ORDER — PROPOFOL 10 MG/ML IV BOLUS
INTRAVENOUS | Status: DC | PRN
  Administered 2019-10-23: 200 mg via INTRAVENOUS

## 2019-10-23 MED ORDER — DEXAMETHASONE SODIUM PHOSPHATE 10 MG/ML IJ SOLN
INTRAMUSCULAR | Status: DC | PRN
  Administered 2019-10-23 (×2): 5 mg via INTRAVENOUS

## 2019-10-23 MED ORDER — SODIUM CHLORIDE 0.9% FLUSH: 3.0000 mL | Freq: Two times a day (BID) | INTRAVENOUS | Status: DC

## 2019-10-23 MED ORDER — ACETAMINOPHEN 325 MG PO TABS: 650.0000 mg | ORAL_TABLET | ORAL | Status: DC | PRN

## 2019-10-23 MED ORDER — ROCURONIUM BROMIDE 10 MG/ML (PF) SYRINGE
PREFILLED_SYRINGE | INTRAVENOUS | Status: AC
  Filled 2019-10-23: qty 10

## 2019-10-23 MED ORDER — KETOROLAC TROMETHAMINE 30 MG/ML IJ SOLN
15.0000 mg | Freq: Once | INTRAMUSCULAR | Status: AC
  Administered 2019-10-23: 15 mg via INTRAVENOUS

## 2019-10-23 MED ORDER — MEPERIDINE HCL 25 MG/ML IJ SOLN: 6.2500 mg | INTRAMUSCULAR | Status: DC | PRN

## 2019-10-23 MED ORDER — EPHEDRINE 5 MG/ML INJ
INTRAVENOUS | Status: AC
  Filled 2019-10-23: qty 10

## 2019-10-23 MED ORDER — ONDANSETRON HCL 4 MG/2ML IJ SOLN
INTRAMUSCULAR | Status: DC | PRN
  Administered 2019-10-23: 4 mg via INTRAVENOUS

## 2019-10-23 MED ORDER — PHENOL 1.4 % MT LIQD: 1.0000 | OROMUCOSAL | Status: DC | PRN

## 2019-10-23 MED ORDER — PHENYLEPHRINE HCL-NACL 10-0.9 MG/250ML-% IV SOLN
INTRAVENOUS | Status: DC | PRN
  Administered 2019-10-23: 25 ug/min via INTRAVENOUS

## 2019-10-23 MED ORDER — HYDROMORPHONE HCL 1 MG/ML IJ SOLN: 0.2500 mg | INTRAMUSCULAR | Status: DC | PRN

## 2019-10-23 MED ORDER — ACETAMINOPHEN 650 MG RE SUPP: 650.0000 mg | RECTAL | Status: DC | PRN

## 2019-10-23 MED ORDER — HYDROXYZINE HCL 25 MG PO TABS: 50.0000 mg | ORAL_TABLET | ORAL | Status: DC | PRN

## 2019-10-23 MED ORDER — ROCURONIUM BROMIDE 10 MG/ML (PF) SYRINGE
PREFILLED_SYRINGE | INTRAVENOUS | Status: DC | PRN
  Administered 2019-10-23: 30 mg via INTRAVENOUS
  Administered 2019-10-23: 40 mg via INTRAVENOUS
  Administered 2019-10-23: 20 mg via INTRAVENOUS
  Administered 2019-10-23: 60 mg via INTRAVENOUS
  Administered 2019-10-23: 10 mg via INTRAVENOUS

## 2019-10-23 MED ORDER — MAGNESIUM HYDROXIDE 400 MG/5ML PO SUSP: 30.0000 mL | Freq: Every day | ORAL | Status: DC | PRN

## 2019-10-23 MED ORDER — SUGAMMADEX SODIUM 200 MG/2ML IV SOLN
INTRAVENOUS | Status: DC | PRN
  Administered 2019-10-23: 200 mg via INTRAVENOUS

## 2019-10-23 MED ORDER — HYDROCODONE-ACETAMINOPHEN 5-325 MG PO TABS: 1.0000 | ORAL_TABLET | ORAL | Status: DC | PRN

## 2019-10-23 MED ORDER — SERTRALINE HCL 50 MG PO TABS: 150.0000 mg | ORAL_TABLET | Freq: Every day | ORAL | Status: DC

## 2019-10-23 MED ORDER — THROMBIN 5000 UNITS EX SOLR
CUTANEOUS | Status: AC
  Filled 2019-10-23: qty 10000

## 2019-10-23 MED ORDER — LIDOCAINE 2% (20 MG/ML) 5 ML SYRINGE
INTRAMUSCULAR | Status: AC
  Filled 2019-10-23: qty 5

## 2019-10-23 MED ORDER — ONDANSETRON HCL 4 MG/2ML IJ SOLN: 4.0000 mg | Freq: Four times a day (QID) | INTRAMUSCULAR | Status: DC | PRN

## 2019-10-23 MED ORDER — MORPHINE SULFATE (PF) 4 MG/ML IV SOLN: 4.0000 mg | INTRAVENOUS | Status: DC | PRN

## 2019-10-23 MED ORDER — HYDROXYZINE HCL 50 MG/ML IM SOLN: 50.0000 mg | INTRAMUSCULAR | Status: DC | PRN

## 2019-10-23 MED ORDER — ASCORBIC ACID 500 MG PO TABS: 500.0000 mg | ORAL_TABLET | Freq: Every day | ORAL | Status: DC

## 2019-10-23 MED ORDER — HEMOSTATIC AGENTS (NO CHARGE) OPTIME
TOPICAL | Status: DC | PRN
  Administered 2019-10-23: 1 via TOPICAL

## 2019-10-23 SURGICAL SUPPLY — 61 items
ADH SKN CLS APL DERMABOND .7 (GAUZE/BANDAGES/DRESSINGS) ×1
BAG DECANTER FOR FLEXI CONT (MISCELLANEOUS) ×2 IMPLANT
BAND INSRT 18 STRL LF DISP RB (MISCELLANEOUS) ×2
BAND RUBBER #18 3X1/16 STRL (MISCELLANEOUS) ×4 IMPLANT
BIT DRILL 14X2.5XNS TI ANT (BIT) IMPLANT
BIT DRILL AVIATOR 14 (BIT) ×2
BIT DRILL NEURO 2X3.1 SFT TUCH (MISCELLANEOUS) ×1 IMPLANT
BIT DRL 14X2.5XNS TI ANT (BIT) ×1
BLADE ULTRA TIP 2M (BLADE) ×1 IMPLANT
CANISTER SUCT 3000ML PPV (MISCELLANEOUS) ×2 IMPLANT
CARTRIDGE OIL MAESTRO DRILL (MISCELLANEOUS) ×1 IMPLANT
COVER MAYO STAND STRL (DRAPES) ×2 IMPLANT
COVER WAND RF STERILE (DRAPES) ×1 IMPLANT
DECANTER SPIKE VIAL GLASS SM (MISCELLANEOUS) ×2 IMPLANT
DERMABOND ADVANCED (GAUZE/BANDAGES/DRESSINGS) ×1
DERMABOND ADVANCED .7 DNX12 (GAUZE/BANDAGES/DRESSINGS) ×1 IMPLANT
DIFFUSER DRILL AIR PNEUMATIC (MISCELLANEOUS) ×2 IMPLANT
DRAPE HALF SHEET 40X57 (DRAPES) IMPLANT
DRAPE LAPAROTOMY 100X72 PEDS (DRAPES) ×2 IMPLANT
DRAPE MICROSCOPE LEICA (MISCELLANEOUS) ×2 IMPLANT
DRILL NEURO 2X3.1 SOFT TOUCH (MISCELLANEOUS) ×2
ELECT COATED BLADE 2.86 ST (ELECTRODE) ×2 IMPLANT
ELECT REM PT RETURN 9FT ADLT (ELECTROSURGICAL) ×2
ELECTRODE REM PT RTRN 9FT ADLT (ELECTROSURGICAL) ×1 IMPLANT
GLOVE BIO SURGEON STRL SZ8 (GLOVE) ×1 IMPLANT
GLOVE BIOGEL PI IND STRL 7.0 (GLOVE) IMPLANT
GLOVE BIOGEL PI IND STRL 8 (GLOVE) ×1 IMPLANT
GLOVE BIOGEL PI INDICATOR 7.0 (GLOVE) ×1
GLOVE BIOGEL PI INDICATOR 8 (GLOVE) ×4
GLOVE ECLIPSE 7.5 STRL STRAW (GLOVE) ×5 IMPLANT
GLOVE EXAM NITRILE XL STR (GLOVE) IMPLANT
GOWN STRL REUS W/ TWL LRG LVL3 (GOWN DISPOSABLE) IMPLANT
GOWN STRL REUS W/ TWL XL LVL3 (GOWN DISPOSABLE) ×1 IMPLANT
GOWN STRL REUS W/TWL 2XL LVL3 (GOWN DISPOSABLE) ×2 IMPLANT
GOWN STRL REUS W/TWL LRG LVL3 (GOWN DISPOSABLE)
GOWN STRL REUS W/TWL XL LVL3 (GOWN DISPOSABLE) ×4
GRAFT CORT CANC 14X8.25X11 5D (Bone Implant) ×1 IMPLANT
GRAFT CORT CANC 9X14X11MM (Bone Implant) ×1 IMPLANT
HALTER HD/CHIN CERV TRACTION D (MISCELLANEOUS) ×2 IMPLANT
HEMOSTAT POWDER KIT SURGIFOAM (HEMOSTASIS) ×2 IMPLANT
KIT BASIN OR (CUSTOM PROCEDURE TRAY) ×2 IMPLANT
KIT TURNOVER KIT B (KITS) ×2 IMPLANT
NDL HYPO 25GX1X1/2 BEV (NEEDLE) ×1 IMPLANT
NDL SPNL 22GX3.5 QUINCKE BK (NEEDLE) ×1 IMPLANT
NEEDLE HYPO 25GX1X1/2 BEV (NEEDLE) ×2 IMPLANT
NEEDLE SPNL 22GX3.5 QUINCKE BK (NEEDLE) ×4 IMPLANT
NS IRRIG 1000ML POUR BTL (IV SOLUTION) ×2 IMPLANT
OIL CARTRIDGE MAESTRO DRILL (MISCELLANEOUS) ×2
PACK LAMINECTOMY NEURO (CUSTOM PROCEDURE TRAY) ×2 IMPLANT
PAD ARMBOARD 7.5X6 YLW CONV (MISCELLANEOUS) ×7 IMPLANT
PLATE AVIATOR ASSY 1LVL SZ 16 (Plate) ×1 IMPLANT
SCREW AVIATOR VAR SELFTAP 4X12 (Screw) ×1 IMPLANT
SCREW AVIATOR VAR SELFTAP 4X14 (Screw) ×4 IMPLANT
SPONGE INTESTINAL PEANUT (DISPOSABLE) ×3 IMPLANT
SPONGE SURGIFOAM ABS GEL SZ50 (HEMOSTASIS) ×2 IMPLANT
STAPLER SKIN PROX WIDE 3.9 (STAPLE) IMPLANT
SUT VIC AB 2-0 CP2 18 (SUTURE) ×2 IMPLANT
SUT VIC AB 3-0 SH 8-18 (SUTURE) ×3 IMPLANT
TOWEL GREEN STERILE (TOWEL DISPOSABLE) ×2 IMPLANT
TOWEL GREEN STERILE FF (TOWEL DISPOSABLE) IMPLANT
WATER STERILE IRR 1000ML POUR (IV SOLUTION) ×2 IMPLANT

## 2019-10-23 NOTE — Op Note (Signed)
10/23/2019  10:25 AM  PATIENT:  Steve Perez  69 y.o. male  PRE-OPERATIVE DIAGNOSIS: C7-T1 cervical disc herniation, left C8 radiculopathy, left intrinsics and grip weakness, cervical spondylosis, cervical degenerative disc disease, status post cervical fusion  POST-OPERATIVE DIAGNOSIS:  C7-T1 cervical disc herniation, left C8 radiculopathy, left intrinsics and grip weakness, cervical spondylosis, cervical degenerative disc disease, status post cervical fusion  PROCEDURE:  Procedure(s): C7-T1 anterior cervical decompression and arthrodesis with structural allograft and aviator cervical plating  SURGEON: Jovita Gamma, MD  ASSISTANTS: Sherley Bounds, MD  ANESTHESIA:   general  EBL:  Total I/O In: 1000 [I.V.:1000] Out: 25 [Blood:25]  BLOOD ADMINISTERED:none  COUNT: Correct per nursing staff  DICTATION: Patient was brought to the operating room placed under general endotracheal anesthesia. Patient was placed in 10 pounds of halter traction. The neck and upper chest was prepped with Betadine soap and solution and draped in a sterile fashion. A horizontal incision was made on the left side of the neck. The line of the incision was infiltrated with local anesthetic with epinephrine. Dissection was carried down thru the subcutaneous tissue and platysma, bipolar cautery was used to maintain hemostasis. Dissection was then carried down thru an avascular plane leaving the sternocleidomastoid carotid artery and jugular vein laterally and the trachea and esophagus medially. The ventral aspect of the vertebral column was identified and a localizing x-ray was taken.  The C7-T1 level could not be visualized, and therefore the C-arm was draped and brought in the field, and the C7-T1 intervertebral disc space was identified with AP and lateral projections.  The annulus was incised and the disc space entered. Discectomy was performed with micro-curettes and pituitary rongeurs. The operating microscope was  draped and brought into the field provided additional magnification illumination and visualization. Discectomy was continued posteriorly thru the disc space and then the cartilaginous endplate was removed using micro-curettes along with the high-speed drill. Posterior osteophytic overgrowth was removed using the high-speed drill along with a 2 mm thin footplated Kerrison punch. Posterior longitudinal ligament along with disc herniation was carefully removed, decompressing the spinal canal and thecal sac. We then continued to remove osteophytic overgrowth and disc material decompressing the neural foramina and exiting nerve roots bilaterally. Once the decompression was completed hemostasis was established with the use of Surgifoam and bipolar cautery.  A thin layer of Surgifoam was left, the wound irrigated and hemostasis confirmed. We then measured the height of the intravertebral disc space and selected a 8 millimeter in height structural allograft. It was hydrated in saline solution and then gently positioned in the intravertebral disc space.  However it was somewhat loose, and we converted to an 8 mm in height structural allograft.  It was also hydrated in saline solution, gently positioned in the intervertebral disc space, and gently countersunk. We then selected a 16 mm millimeter in height Aviator cervical plate. It was positioned over the fusion construct and secured to the vertebra with 4 mm diameter screws.  At the C7 level we used a 12 mm screw on the left and a 14 mm screw on the right.  At the T1 level we used 14 mm screws bilaterally.  Each screw hole was started with the high-speed drill and then the screws placed once all the screws were placed final tightening was performed, and then the locking system was secured. The wound was irrigated with bacitracin solution checked for hemostasis which was established and confirmed.  The C-arm was brought back into the field, AP and lateral  images were taken  which showed the graft in good position, the plate and screws in good position, and the overall alignment to be good. We then proceeded with closure. The platysma was closed with interrupted inverted 2-0 undyed Vicryl suture, the subcutaneous and subcuticular closed with interrupted inverted 3-0 undyed Vicryl suture. The skin edges were approximated with Dermabond. Following surgery the patient was taken out of cervical traction. To be reversed and the anesthetic and taken to the recovery room for further care.  PLAN OF CARE: Admit for overnight observation  PATIENT DISPOSITION:  PACU - hemodynamically stable.   Delay start of Pharmacological VTE agent (>24hrs) due to surgical blood loss or risk of bleeding:  yes

## 2019-10-23 NOTE — Discharge Instructions (Signed)
Wound Care Leave incision open to air. You may shower. Do not scrub directly on incision.  Do not put any creams, lotions, or ointments on incision. Activity Walk each and every day, increasing distance each day. No lifting greater than 5 lbs.  Avoid excessive neck motion. No driving for 2 weeks; may ride as a passenger locally. Wear neck brace at all times except when showering.  If provided soft collar, may wear for comfort unless otherwise instructed. Diet Resume your normal diet.  Return to Work Will be discussed at you follow up appointment. Call Your Doctor If Any of These Occur Redness, drainage, or swelling at the wound.  Temperature greater than 101 degrees. Severe pain not relieved by pain medication. Increased difficulty swallowing. Incision starts to come apart. Follow Up Appt Call today for appointment in 3 weeks [id number]) or for problems.  If you have any hardware placed in your spine, you will need an x-ray before your appointment.     Anterior Cervical Diskectomy and Fusion, Care After This sheet gives you information about how to care for yourself after your procedure. Your health care provider may also give you more specific instructions. If you have problems or questions, contact your health care provider. What can I expect after the procedure? After the procedure, it is common to have:  Neck pain.  Discomfort when swallowing.  Slight hoarseness. Follow these instructions at home: If you have a neck brace:  Wear it as told by your health care provider. Remove it only as told by your health care provider.  Keep the brace clean and dry.  Ask your health care provider if you should remove the brace to bathe or shower. Incision care   Follow instructions from your health care provider about how to take care of your incision. Make sure you: ? Wash your hands with soap and water before and after you change your bandage (dressing). If soap and water are not  available, use hand sanitizer. ? Change your dressing as told by your health care provider. ? Leave stitches (sutures), skin glue, or adhesive strips in place. These skin closures may need to stay in place for 2 weeks or longer. If adhesive strip edges start to loosen and curl up, you may trim the loose edges. Do not remove adhesive strips completely unless your health care provider tells you to do that.  Check your incision area every day for signs of infection. Check for: ? Redness, swelling, or pain. ? Fluid or blood. ? Warmth. ? Pus or a bad smell. Managing pain, stiffness, and swelling   Take over-the-counter and prescription medicines only as told by your health care provider.  If directed, put ice on the injured area. ? If you have a removable brace, remove it as told by your health care provider. ? Put ice in a plastic bag. ? Place a towel between your skin and the bag. ? Leave the ice on for 20 minutes, 2-3 times a day. Activity   Return to your normal activities as told by your health care provider. Ask your health care provider what activities are safe for you.  Do exercises as told by your health care provider.  Do not take baths, swim, or use a hot tub until your health care provider approves.  Do not lift anything that is heavier than 10 lb (4.5 kg), or the limit that you are told, until your health care provider says that it is safe. General instructions  Ask your  health care provider if the medicine prescribed to you: ? Requires you to avoid driving or using heavy machinery. ? Can cause constipation. You may need to take actions to prevent or treat constipation, such as:  Drink enough fluid to keep your urine pale yellow.  Take over-the-counter or prescription medicines.  Eat foods that are high in fiber, such as beans, whole grains, and fresh fruits and vegetables.  Limit foods that are high in fat and processed sugars, such as fried and sweet foods.  Do  not use any products that contain nicotine or tobacco, such as cigarettes, e-cigarettes, and chewing tobacco. These can delay healing. If you need help quitting, ask your health care provider.  Keep all follow-up visits and physical therapy appointments as told by your health care provider. This is important. Contact a health care provider if you have:  A fever.  Redness, swelling, or pain around your incision.  Fluid or blood coming from your incision.  Pus or a bad smell coming from your incision.  Pain that is not controlled by your pain medicine.  Increasing hoarseness or trouble swallowing. Get help right away if you have:  Severe pain.  Sudden numbness or weakness in your arms.  Warmth, tenderness, or swelling in your calf.  Chest pain.  Difficulty breathing. Summary  After the procedure, it is common to have neck pain, discomfort when swallowing, and slight hoarseness.  Follow instructions from your health care provider about how to take care of your incision.  Check your incision area every day for signs of infection.  Return to your normal activities as told by your health care provider. Ask your health care provider what activities are safe for you.  Contact a health care provider if you have signs of infection at your incision. This information is not intended to replace advice given to you by your health care provider. Make sure you discuss any questions you have with your health care provider. Document Revised: 06/07/2018 Document Reviewed: 06/07/2018 Elsevier Patient Education  2020 [employer].

## 2019-10-23 NOTE — Discharge Summary (Signed)
Physician Discharge Summary  Patient ID: Steve Perez MRN: [id number] DOB/AGE: 01/29/1951 69 y.o.  Admit date: 10/23/2019 Discharge date: 10/23/2019  Admission Diagnoses:  C7-T1 cervical disc herniation, left C8 radiculopathy, left intrinsics and grip weakness, cervical spondylosis, cervical degenerative disc disease, status post cervical fusion  Discharge Diagnoses:  C7-T1 cervical disc herniation, left C8 radiculopathy, left intrinsics and grip weakness, cervical spondylosis, cervical degenerative disc disease, status post cervical fusion Active Problems:   HNP (herniated nucleus pulposus), cervical   Discharged Condition: good  Hospital Course: Patient was admitted, underwent a C7-T1 ACDF with structural allograft and cervical plating.  He has had excellent relief of his left C8 cervical radiculopathy.  The pain and numbness have resolved, and he has recovered the strength fully in his left intrinsics and grip (5/5).  The incision is clean and dry, there is a small bit of subcutaneous bruising, but no swelling, erythema, or drainage.  He is up and ambulating actively in the halls.  He is voiding well.  We are discharging him to home with instructions regarding wound care and activities.  He is scheduled follow-up with me in the office in 3 weeks.  Discharge Exam: Blood pressure 138/75, pulse 75, temperature 98.9 F (37.2 C), temperature source Oral, resp. rate 18, height 6' (1.829 m), weight 99.8 kg, SpO2 98 %.  Disposition: Discharge disposition: 01-Home or Self Care       Discharge Instructions    Discharge wound care:   Complete by: As directed    Leave the wound open to air. Shower daily with the wound uncovered. Water and soapy water should run over the incision area. Do not wash directly on the incision for 2 weeks. Remove the glue after 2 weeks.   Driving Restrictions   Complete by: As directed    No driving for 2 weeks. May ride in the car locally now. May begin to drive  locally in 2 weeks.   Other Restrictions   Complete by: As directed    Walk gradually increasing distances out in the fresh air at least twice a day. Walking additional 6 times inside the house, gradually increasing distances, daily. No bending, lifting, or twisting. Perform activities between shoulder and waist height (that is at counter height when standing or table height when sitting).     Allergies as of 10/23/2019   No Known Allergies     Medication List    STOP taking these medications   oxyCODONE-acetaminophen 5-325 MG tablet Commonly known as: PERCOCET/ROXICET     TAKE these medications   aspirin EC 81 MG tablet Take 81 mg by mouth daily.   atorvastatin 20 MG tablet Commonly known as: LIPITOR Take 20 mg by mouth daily.   Fish Oil 1200 MG Caps Take 1,200 mg by mouth daily.   HYDROcodone-acetaminophen 5-325 MG tablet Commonly known as: NORCO/VICODIN Take 1-2 tablets by mouth every 4 (four) hours as needed for moderate pain. What changed: Another medication with the same name was added. Make sure you understand how and when to take each.   HYDROcodone-acetaminophen 5-325 MG tablet Commonly known as: Norco Take 1-2 tablets by mouth every 6 (six) hours as needed (pain). What changed: You were already taking a medication with the same name, and this prescription was added. Make sure you understand how and when to take each.   lisinopril 20 MG tablet Commonly known as: ZESTRIL Take 20 mg by mouth daily.   meloxicam 15 MG tablet Commonly known as: MOBIC Take 15 mg  by mouth daily.   MULTIVITAMIN PO Take 1 tablet by mouth daily.   Proventil HFA 108 (90 Base) MCG/ACT inhaler Generic drug: albuterol Inhale 2 puffs into the lungs every 6 (six) hours as needed for wheezing or shortness of breath.   sertraline 100 MG tablet Commonly known as: ZOLOFT Take 150 mg by mouth daily.   vitamin B-12 1000 MCG tablet Commonly known as: CYANOCOBALAMIN Take 1,000 mcg by mouth  daily.   VITAMIN C PO Take 500 mg by mouth daily.   Vitamin D 50 MCG (2000 UT) Caps Take 2,000 Units by mouth daily.            Discharge Care Instructions  (From admission, onward)         Start     Ordered   10/23/19 0000  Discharge wound care:    Comments: Leave the wound open to air. Shower daily with the wound uncovered. Water and soapy water should run over the incision area. Do not wash directly on the incision for 2 weeks. Remove the glue after 2 weeks.   10/23/19 1644           Signed: Hosie Spangle 10/23/2019, 4:45 PM

## 2019-10-23 NOTE — H&P (Signed)
Subjective: Patient is a 69 y.o. right-handed white male who is admitted for treatment of central to left C7-T1 cervical disc herniation.  Patient's difficulties began 2 months ago with pain around the left scapula numbness and tilling into the left fifth digit and tingling that around to the left arm and forearm and into the left fifth digit.  Extension of his neck and tilting it to the right will cause a jolt of pain down to the left upper extremity.  His examination has shown progressive loss of strength in the left hand.  Left hand intrinsics are 4, grip is 4-4+.  MRI of the cervical spine revealed a central to left C7-T1 cervical disc herniation and the patient admitted now for single level C7-T1 anterior cervical decompression and arthrodesis.  History is notable for previous ACDF with good fusion at the C4-5, C5-6, and C6-7 levels.   Patient Active Problem List   Diagnosis Date Noted  . HNP (herniated nucleus pulposus), lumbar 12/01/2016  . 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 HNP (herniated nucleus pulposus), cervical 11/20/2014   Past Medical History:  Diagnosis Date  . Anxiety    panic attacks  . Arthritis   . Asthma   . History of bronchitis   . Hypertension   . Joint inflammation    thumb, bilaterally    Past Surgical History:  Procedure Laterality Date  . ANTERIOR CERVICAL DECOMP/DISCECTOMY FUSION N/A 11/20/2014   Procedure: ANTERIOR CERVICAL DECOMPRESSION/DISCECTOMY FUSION CERVICAL FOUR-FIVE,CERVICAL FIVE-SIX WITH/HARDWARE REMOVAL  AT CERVICAL FIVE-SIX,CERVICAL SIX-SEVEN.;  Surgeon: Hosie Spangle, MD;  Location: [facility];  Service: Neurosurgery;  Laterality: N/A;  . arthroscopic knee Left   . BACK SURGERY    . CERVICAL FUSION    . COLONOSCOPY    . FRACTURE SURGERY Right    right ankle  . POSTERIOR CERVICAL FUSION/FORAMINOTOMY N/A 08/10/2015   Procedure: CERVICAL FOUR-FIVE, CERVICAL FIVE-SIX POSTERIOR CERVICAL FUSION/FORAMINOTOMY ;  Surgeon: Jovita Gamma, MD;  Location: [facility];  Service: Neurosurgery;  Laterality: N/A;  C4-C6 posterior cervical fusion with instrumentation  . TONSILLECTOMY      Medications Prior to Admission  Medication Sig Dispense Refill Last Dose  . Ascorbic Acid (VITAMIN C PO) Take 500 mg by mouth daily.    Past Month at Unknown time  . aspirin EC 81 MG tablet Take 81 mg by mouth daily.   Past Month at Unknown time  . atorvastatin (LIPITOR) 20 MG tablet Take 20 mg by mouth daily.  2 10/23/2019 at 0500  . Cholecalciferol (VITAMIN D) 50 MCG (2000 UT) CAPS Take 2,000 Units by mouth daily.   Past Month at Unknown time  . lisinopril (PRINIVIL,ZESTRIL) 20 MG tablet Take 20 mg by mouth daily.  2 10/23/2019 at 0500  . meloxicam (MOBIC) 15 MG tablet Take 15 mg by mouth daily.  5 10/23/2019 at 0500  . Multiple Vitamins-Minerals (MULTIVITAMIN PO) Take 1 tablet by mouth daily.   Past Month at Unknown time  . Omega-3 Fatty Acids (FISH OIL) 1200 MG CAPS Take 1,200 mg by mouth daily.    Past Month at Unknown time  . sertraline (ZOLOFT) 100 MG tablet Take 150 mg by mouth daily.  2 10/23/2019 at 0500  . vitamin B-12 (CYANOCOBALAMIN) 1000 MCG tablet Take 1,000 mcg by mouth daily.   Past Month at Unknown time  . HYDROcodone-acetaminophen (NORCO/VICODIN) 5-325 MG tablet Take 1-2 tablets by mouth every 4 (four) hours as needed for moderate pain. (Patient not taking: Reported on  10/14/2019) 50 tablet 0 Not Taking at Unknown time  . oxyCODONE-acetaminophen (PERCOCET/ROXICET) 5-325 MG tablet Take 1-2 tablets by mouth every 4 (four) hours as needed for moderate pain. (Patient not taking: Reported on 10/14/2019) 60 tablet 0 Not Taking at Unknown time  . PROVENTIL HFA 108 (90 BASE) MCG/ACT inhaler Inhale 2 puffs into the lungs every 6 (six) hours as needed for wheezing or shortness of breath.   11 More than a month at Unknown time   No Known Allergies  Social History   Tobacco Use  . Smoking status: Current Every Day Smoker    Types: Cigars   . Smokeless tobacco: Never Used  . Tobacco comment: "couple cigars a day"  Substance Use Topics  . Alcohol use: Yes    Alcohol/week: 1.0 standard drinks    Types: 1 Glasses of wine per week    Comment: "I drink about 2 glasses of wine a day but I haven't drank this week for surgery"    History reviewed. No pertinent family history.   Review of Systems Pertinent items noted in HPI and remainder of comprehensive ROS otherwise negative.  Objective: Vital signs in last 24 hours: Temp:  [98.8 F (37.1 C)] 98.8 F (37.1 C) (01/27 0624) Pulse Rate:  [63] 63 (01/27 0624) Resp:  [18] 18 (01/27 0624) BP: (157)/(80) 157/80 (01/27 0624) SpO2:  [99 %] 99 % (01/27 0624) Weight:  [99.8 kg] 99.8 kg (01/27 0624)  EXAM: Patient is a well-developed well-nourished white male in no acute distress.   Lungs are clear to auscultation , the patient has symmetrical respiratory excursion. Heart has a regular rate and rhythm normal S1 and S2 no murmur.   Abdomen is soft nontender nondistended bowel sounds are present. Extremity examination shows no clubbing cyanosis or edema. Motor examination shows 5/5 strength in the deltoid, biceps, and triceps bilaterally, and the right intrinsics and grip.  However the left intrinsics are 4 and the left grip is 4-4+.  Sensation is intact to pinprick throughout the digits of the upper extremities. Reflexes are symmetrical and without evidence of pathologic reflexes. Patient has a normal gait and stance.   Data Review:CBC    Component Value Date/Time   WBC 5.6 10/21/2019 0856   RBC 3.94 (L) 10/21/2019 0856   HGB 13.2 10/21/2019 0856   HCT 39.9 10/21/2019 0856   PLT 203 10/21/2019 0856   MCV 101.3 (H) 10/21/2019 0856   MCH 33.5 10/21/2019 0856   MCHC 33.1 10/21/2019 0856   RDW 12.1 10/21/2019 0856                          BMET    Component Value Date/Time   NA 139 10/21/2019 0856   K 4.8 10/21/2019 0856   CL 105 10/21/2019 0856   CO2 27 10/21/2019 0856    GLUCOSE 109 (H) 10/21/2019 0856   BUN 23 10/21/2019 0856   CREATININE 0.98 10/21/2019 0856   CALCIUM 9.3 10/21/2019 0856   GFRNONAA >60 10/21/2019 0856   GFRAA >60 10/21/2019 0856     Assessment/Plan: Patient with left C8 radiculopathy with progressive weakness in the left hand due to a central left C7-T1 cervical disc herniation, who is admitted for a C7-T1 anterior cervical decompression arthrodesis.  I've discussed with the patient the nature of his condition, the nature the surgical procedure, the typical length of surgery, hospital stay, and overall recuperation. We discussed limitations postoperatively. I discussed risks of surgery including risks  of infection, bleeding, possibly need for transfusion, the risk of nerve root dysfunction with pain, weakness, numbness, or paresthesias, the risk of spinal cord dysfunction with paralysis of all 4 limbs and quadriplegia, and the risk of dural tear and CSF leakage and possible need for further surgery, the risk of esophageal dysfunction causing dysphagia and the risk of laryngeal dysfunction causing hoarseness of the voice, the risk of failure of the arthrodesis and the possible need for further surgery, and the risk of anesthetic complications including myocardial infarction, stroke, pneumonia, and death. We also discussed the need for postoperative immobilization in a cervical collar. Understanding all this the patient does wish to proceed with surgery and is admitted for such.   Hosie Spangle, MD 10/23/2019 7:05 AM

## 2019-10-23 NOTE — Anesthesia Postprocedure Evaluation (Signed)
Anesthesia Post Note  Patient: Steve Perez  Procedure(s) Performed: ANTERIOR CERVICAL DECOMPRESSION/DISCECTOMY FUSION  CERVICAL SEVEN- THORACIC ONE (N/A Spine Cervical)     Patient location during evaluation: PACU Anesthesia Type: General Level of consciousness: awake and alert, patient cooperative and oriented Pain management: pain level controlled Vital Signs Assessment: post-procedure vital signs reviewed and stable Respiratory status: spontaneous breathing, nonlabored ventilation and respiratory function stable Cardiovascular status: blood pressure returned to baseline and stable Postop Assessment: no apparent nausea or vomiting and adequate PO intake Anesthetic complications: no    Last Vitals:  Vitals:   10/23/19 1115 10/23/19 1614  BP: (!) 116/58 138/75  Pulse: 83 75  Resp: 18 18  Temp: 37 C 37.2 C  SpO2: 95% 98%    Last Pain:  Vitals:   10/23/19 1614  TempSrc: Oral  PainSc:                  Iyannah Blake,E. Shneur [place]

## 2019-10-23 NOTE — Anesthesia Procedure Notes (Addendum)
Procedure Name: Intubation Date/Time: 10/23/2019 7:40 AM Performed by: Milford Cage, CRNA Pre-anesthesia Checklist: Patient identified, Emergency Drugs available, Suction available and Patient being monitored Patient Re-evaluated:Patient Re-evaluated prior to induction Oxygen Delivery Method: Circle System Utilized Preoxygenation: Pre-oxygenation with 100% oxygen Induction Type: IV induction Ventilation: Mask ventilation without difficulty and Two handed mask ventilation required Laryngoscope Size: Glidescope and 4 Grade View: Grade I Tube type: Oral Tube size: 7.5 mm Number of attempts: 1 Airway Equipment and Method: Stylet and Video-laryngoscopy Placement Confirmation: ETT inserted through vocal cords under direct vision,  positive ETCO2 and breath sounds checked- equal and bilateral Secured at: 23 cm Tube secured with: Tape Dental Injury: Teeth and Oropharynx as per pre-operative assessment  Comments: A.Jarrell SRNA intubated under CRNA supervision C-Spine neutrality maintained

## 2019-10-23 NOTE — Plan of Care (Signed)
Pt doing well. Pt given D/C instructions with verbal understanding. Pt's incision is clean and dry with no sign of infection. Pt's IV's were removed prior to D/C. Pt D/C'd home via wheelchair per MD order. Pt is stable @ D/C and has no other needs at this time. Holli Humbles, RN

## 2019-10-23 NOTE — Transfer of Care (Signed)
Immediate Anesthesia Transfer of Care Note  Patient: Steve Perez  Procedure(s) Performed: ANTERIOR CERVICAL DECOMPRESSION/DISCECTOMY FUSION  CERVICAL SEVEN- THORACIC ONE (N/A Spine Cervical)  Patient Location: PACU  Anesthesia Type:General  Level of Consciousness: awake, alert  and oriented  Airway & Oxygen Therapy: Patient Spontanous Breathing and Patient connected to nasal cannula oxygen  Post-op Assessment: Report given to RN and Post -op Vital signs reviewed and stable  Post vital signs: Reviewed and stable  Last Vitals:  Vitals Value Taken Time  BP 130/67 10/23/19 1037  Temp    Pulse 85 10/23/19 1038  Resp 23 10/23/19 1038  SpO2 94 % 10/23/19 1038  Vitals shown include unvalidated device data.  Last Pain:  Vitals:   10/23/19 0631  PainSc: 4          Complications: No apparent anesthesia complications

## 2019-10-24 ENCOUNTER — Encounter: Payer: Self-pay | Admitting: *Deleted

## 2019-10-25 DIAGNOSIS — Z125 Encounter for screening for malignant neoplasm of prostate: Secondary | ICD-10-CM | POA: Diagnosis not present

## 2019-10-25 DIAGNOSIS — R7301 Impaired fasting glucose: Secondary | ICD-10-CM | POA: Diagnosis not present

## 2019-10-25 DIAGNOSIS — E7849 Other hyperlipidemia: Secondary | ICD-10-CM | POA: Diagnosis not present

## 2019-10-25 DIAGNOSIS — Z0000 Encounter for general adult medical examination without abnormal findings: Secondary | ICD-10-CM | POA: Diagnosis not present

## 2019-10-25 DIAGNOSIS — I10 Essential (primary) hypertension: Secondary | ICD-10-CM | POA: Diagnosis not present

## 2019-10-30 DIAGNOSIS — I6523 Occlusion and stenosis of bilateral carotid arteries: Secondary | ICD-10-CM | POA: Diagnosis not present

## 2019-10-30 DIAGNOSIS — Z1331 Encounter for screening for depression: Secondary | ICD-10-CM | POA: Diagnosis not present

## 2019-10-30 DIAGNOSIS — Z0000 Encounter for general adult medical examination without abnormal findings: Secondary | ICD-10-CM | POA: Diagnosis not present

## 2019-10-30 DIAGNOSIS — M5412 Radiculopathy, cervical region: Secondary | ICD-10-CM | POA: Diagnosis not present

## 2019-10-30 DIAGNOSIS — Z1339 Encounter for screening examination for other mental health and behavioral disorders: Secondary | ICD-10-CM | POA: Diagnosis not present

## 2019-10-30 DIAGNOSIS — R7301 Impaired fasting glucose: Secondary | ICD-10-CM | POA: Diagnosis not present

## 2019-10-30 DIAGNOSIS — N529 Male erectile dysfunction, unspecified: Secondary | ICD-10-CM | POA: Diagnosis not present

## 2019-10-30 DIAGNOSIS — J45909 Unspecified asthma, uncomplicated: Secondary | ICD-10-CM | POA: Diagnosis not present

## 2019-10-30 DIAGNOSIS — R69 Illness, unspecified: Secondary | ICD-10-CM | POA: Diagnosis not present

## 2019-10-30 DIAGNOSIS — I10 Essential (primary) hypertension: Secondary | ICD-10-CM | POA: Diagnosis not present

## 2019-10-30 DIAGNOSIS — E785 Hyperlipidemia, unspecified: Secondary | ICD-10-CM | POA: Diagnosis not present

## 2019-11-06 DIAGNOSIS — L821 Other seborrheic keratosis: Secondary | ICD-10-CM | POA: Diagnosis not present

## 2019-11-06 DIAGNOSIS — L814 Other melanin hyperpigmentation: Secondary | ICD-10-CM | POA: Diagnosis not present

## 2019-11-06 DIAGNOSIS — L818 Other specified disorders of pigmentation: Secondary | ICD-10-CM | POA: Diagnosis not present

## 2019-11-06 DIAGNOSIS — D225 Melanocytic nevi of trunk: Secondary | ICD-10-CM | POA: Diagnosis not present

## 2019-11-06 DIAGNOSIS — D692 Other nonthrombocytopenic purpura: Secondary | ICD-10-CM | POA: Diagnosis not present

## 2019-11-15 DIAGNOSIS — M5412 Radiculopathy, cervical region: Secondary | ICD-10-CM | POA: Diagnosis not present

## 2019-11-15 DIAGNOSIS — M5020 Other cervical disc displacement, unspecified cervical region: Secondary | ICD-10-CM | POA: Diagnosis not present

## 2019-11-15 DIAGNOSIS — M4722 Other spondylosis with radiculopathy, cervical region: Secondary | ICD-10-CM | POA: Diagnosis not present

## 2019-11-15 DIAGNOSIS — Z981 Arthrodesis status: Secondary | ICD-10-CM | POA: Diagnosis not present

## 2019-11-16 ENCOUNTER — Ambulatory Visit: Payer: Medicare HMO | Attending: Internal Medicine

## 2019-11-16 DIAGNOSIS — Z23 Encounter for immunization: Secondary | ICD-10-CM

## 2019-11-16 NOTE — Progress Notes (Signed)
   Covid-19 Vaccination Clinic  Name:  Steve Perez    MRN: [id number] DOB: September 11, 1951  11/16/2019  Steve Perez was observed post Covid-19 immunization for 15 minutes without incidence. He was provided with Vaccine Information Sheet and instruction to access the V-Safe system.   Steve Perez was instructed to call 911 with any severe reactions post vaccine: Marland Kitchen Difficulty breathing  . Swelling of your face and throat  . A fast heartbeat  . A bad rash all over your body  . Dizziness and weakness    Immunizations Administered    Name Date Dose VIS Date Route   Pfizer COVID-19 Vaccine 11/16/2019  8:13 AM 0.3 mL 09/06/2019 Intramuscular   Manufacturer: [place]   Lot: [id number]   [place]: [id number]

## 2019-12-11 ENCOUNTER — Ambulatory Visit: Payer: Medicare HMO | Attending: Internal Medicine

## 2019-12-11 DIAGNOSIS — Z23 Encounter for immunization: Secondary | ICD-10-CM

## 2019-12-11 NOTE — Progress Notes (Signed)
   Covid-19 Vaccination Clinic  Name:  Suvir Goertz    MRN: [id number] DOB: 1951-09-18  12/11/2019  Mr. Villamor was observed post Covid-19 immunization for 15 minutes without incident. He was provided with Vaccine Information Sheet and instruction to access the V-Safe system.   Mr. Minto was instructed to call 911 with any severe reactions post vaccine: Marland Kitchen Difficulty breathing  . Swelling of face and throat  . A fast heartbeat  . A bad rash all over body  . Dizziness and weakness   Immunizations Administered    Name Date Dose VIS Date Route   Pfizer COVID-19 Vaccine 12/11/2019  8:23 AM 0.3 mL 09/06/2019 Intramuscular   Manufacturer: [place]   Lot: [id number]   [place]: [id number]

## 2020-01-03 DIAGNOSIS — S0081XA Abrasion of other part of head, initial encounter: Secondary | ICD-10-CM | POA: Diagnosis not present

## 2020-01-07 DIAGNOSIS — R29898 Other symptoms and signs involving the musculoskeletal system: Secondary | ICD-10-CM | POA: Diagnosis not present

## 2020-01-07 DIAGNOSIS — M5412 Radiculopathy, cervical region: Secondary | ICD-10-CM | POA: Diagnosis not present

## 2020-01-07 DIAGNOSIS — I10 Essential (primary) hypertension: Secondary | ICD-10-CM | POA: Diagnosis not present

## 2020-01-07 DIAGNOSIS — Z981 Arthrodesis status: Secondary | ICD-10-CM | POA: Diagnosis not present

## 2020-01-07 DIAGNOSIS — Z6830 Body mass index (BMI) 30.0-30.9, adult: Secondary | ICD-10-CM | POA: Diagnosis not present

## 2020-02-29 DIAGNOSIS — R69 Illness, unspecified: Secondary | ICD-10-CM | POA: Diagnosis not present

## 2020-03-13 DIAGNOSIS — Z0100 Encounter for examination of eyes and vision without abnormal findings: Secondary | ICD-10-CM | POA: Diagnosis not present

## 2020-03-25 DIAGNOSIS — R69 Illness, unspecified: Secondary | ICD-10-CM | POA: Diagnosis not present

## 2020-03-31 DIAGNOSIS — M960 Pseudarthrosis after fusion or arthrodesis: Secondary | ICD-10-CM | POA: Diagnosis not present

## 2020-03-31 DIAGNOSIS — Z981 Arthrodesis status: Secondary | ICD-10-CM | POA: Diagnosis not present

## 2020-04-01 DIAGNOSIS — R69 Illness, unspecified: Secondary | ICD-10-CM | POA: Diagnosis not present

## 2020-05-19 DIAGNOSIS — M5416 Radiculopathy, lumbar region: Secondary | ICD-10-CM | POA: Diagnosis not present

## 2020-05-28 DIAGNOSIS — M5416 Radiculopathy, lumbar region: Secondary | ICD-10-CM | POA: Diagnosis not present

## 2020-06-11 DIAGNOSIS — M5126 Other intervertebral disc displacement, lumbar region: Secondary | ICD-10-CM | POA: Diagnosis not present

## 2020-06-11 DIAGNOSIS — M5416 Radiculopathy, lumbar region: Secondary | ICD-10-CM | POA: Diagnosis not present

## 2020-06-16 DIAGNOSIS — M5416 Radiculopathy, lumbar region: Secondary | ICD-10-CM | POA: Diagnosis not present

## 2020-07-04 DIAGNOSIS — Z23 Encounter for immunization: Secondary | ICD-10-CM | POA: Diagnosis not present

## 2020-07-10 DIAGNOSIS — M5416 Radiculopathy, lumbar region: Secondary | ICD-10-CM | POA: Diagnosis not present

## 2020-08-04 DIAGNOSIS — M5416 Radiculopathy, lumbar region: Secondary | ICD-10-CM | POA: Diagnosis not present

## 2020-08-04 DIAGNOSIS — M960 Pseudarthrosis after fusion or arthrodesis: Secondary | ICD-10-CM | POA: Diagnosis not present

## 2020-10-23 DIAGNOSIS — R7301 Impaired fasting glucose: Secondary | ICD-10-CM | POA: Diagnosis not present

## 2020-10-23 DIAGNOSIS — E785 Hyperlipidemia, unspecified: Secondary | ICD-10-CM | POA: Diagnosis not present

## 2020-10-23 DIAGNOSIS — Z125 Encounter for screening for malignant neoplasm of prostate: Secondary | ICD-10-CM | POA: Diagnosis not present

## 2020-10-23 DIAGNOSIS — Z0000 Encounter for general adult medical examination without abnormal findings: Secondary | ICD-10-CM | POA: Diagnosis not present

## 2020-10-23 DIAGNOSIS — I10 Essential (primary) hypertension: Secondary | ICD-10-CM | POA: Diagnosis not present

## 2020-10-30 DIAGNOSIS — Z1331 Encounter for screening for depression: Secondary | ICD-10-CM | POA: Diagnosis not present

## 2020-10-30 DIAGNOSIS — I6523 Occlusion and stenosis of bilateral carotid arteries: Secondary | ICD-10-CM | POA: Diagnosis not present

## 2020-10-30 DIAGNOSIS — L309 Dermatitis, unspecified: Secondary | ICD-10-CM | POA: Diagnosis not present

## 2020-10-30 DIAGNOSIS — Z1212 Encounter for screening for malignant neoplasm of rectum: Secondary | ICD-10-CM | POA: Diagnosis not present

## 2020-10-30 DIAGNOSIS — R82998 Other abnormal findings in urine: Secondary | ICD-10-CM | POA: Diagnosis not present

## 2020-10-30 DIAGNOSIS — M542 Cervicalgia: Secondary | ICD-10-CM | POA: Diagnosis not present

## 2020-10-30 DIAGNOSIS — E785 Hyperlipidemia, unspecified: Secondary | ICD-10-CM | POA: Diagnosis not present

## 2020-10-30 DIAGNOSIS — R7301 Impaired fasting glucose: Secondary | ICD-10-CM | POA: Diagnosis not present

## 2020-10-30 DIAGNOSIS — Z1339 Encounter for screening examination for other mental health and behavioral disorders: Secondary | ICD-10-CM | POA: Diagnosis not present

## 2020-10-30 DIAGNOSIS — R69 Illness, unspecified: Secondary | ICD-10-CM | POA: Diagnosis not present

## 2020-10-30 DIAGNOSIS — Z0000 Encounter for general adult medical examination without abnormal findings: Secondary | ICD-10-CM | POA: Diagnosis not present

## 2020-10-30 DIAGNOSIS — I10 Essential (primary) hypertension: Secondary | ICD-10-CM | POA: Diagnosis not present

## 2020-11-05 DIAGNOSIS — L700 Acne vulgaris: Secondary | ICD-10-CM | POA: Diagnosis not present

## 2020-11-05 DIAGNOSIS — D485 Neoplasm of uncertain behavior of skin: Secondary | ICD-10-CM | POA: Diagnosis not present

## 2020-11-05 DIAGNOSIS — L308 Other specified dermatitis: Secondary | ICD-10-CM | POA: Diagnosis not present

## 2020-11-05 DIAGNOSIS — L859 Epidermal thickening, unspecified: Secondary | ICD-10-CM | POA: Diagnosis not present

## 2020-11-05 DIAGNOSIS — D225 Melanocytic nevi of trunk: Secondary | ICD-10-CM | POA: Diagnosis not present

## 2020-11-05 DIAGNOSIS — L281 Prurigo nodularis: Secondary | ICD-10-CM | POA: Diagnosis not present

## 2020-11-11 DIAGNOSIS — H524 Presbyopia: Secondary | ICD-10-CM | POA: Diagnosis not present

## 2020-12-29 DIAGNOSIS — Z6828 Body mass index (BMI) 28.0-28.9, adult: Secondary | ICD-10-CM | POA: Diagnosis not present

## 2020-12-29 DIAGNOSIS — I10 Essential (primary) hypertension: Secondary | ICD-10-CM | POA: Diagnosis not present

## 2020-12-29 DIAGNOSIS — M5416 Radiculopathy, lumbar region: Secondary | ICD-10-CM | POA: Diagnosis not present

## 2021-01-22 DIAGNOSIS — Z9889 Other specified postprocedural states: Secondary | ICD-10-CM | POA: Diagnosis not present

## 2021-01-22 DIAGNOSIS — H2513 Age-related nuclear cataract, bilateral: Secondary | ICD-10-CM | POA: Diagnosis not present

## 2021-02-18 DIAGNOSIS — H2511 Age-related nuclear cataract, right eye: Secondary | ICD-10-CM | POA: Diagnosis not present

## 2021-02-18 DIAGNOSIS — H2512 Age-related nuclear cataract, left eye: Secondary | ICD-10-CM | POA: Diagnosis not present

## 2021-04-29 DIAGNOSIS — H2512 Age-related nuclear cataract, left eye: Secondary | ICD-10-CM | POA: Diagnosis not present

## 2021-06-17 DIAGNOSIS — Z0100 Encounter for examination of eyes and vision without abnormal findings: Secondary | ICD-10-CM | POA: Diagnosis not present

## 2021-06-17 DIAGNOSIS — H524 Presbyopia: Secondary | ICD-10-CM | POA: Diagnosis not present

## 2021-11-08 DIAGNOSIS — L738 Other specified follicular disorders: Secondary | ICD-10-CM | POA: Diagnosis not present

## 2021-11-08 DIAGNOSIS — D1801 Hemangioma of skin and subcutaneous tissue: Secondary | ICD-10-CM | POA: Diagnosis not present

## 2021-11-08 DIAGNOSIS — D225 Melanocytic nevi of trunk: Secondary | ICD-10-CM | POA: Diagnosis not present

## 2021-11-08 DIAGNOSIS — L720 Epidermal cyst: Secondary | ICD-10-CM | POA: Diagnosis not present

## 2021-11-10 DIAGNOSIS — Z86010 Personal history of colonic polyps: Secondary | ICD-10-CM | POA: Diagnosis not present

## 2021-11-10 DIAGNOSIS — D122 Benign neoplasm of ascending colon: Secondary | ICD-10-CM | POA: Diagnosis not present

## 2021-11-12 DIAGNOSIS — D122 Benign neoplasm of ascending colon: Secondary | ICD-10-CM | POA: Diagnosis not present

## 2021-11-23 DIAGNOSIS — Z961 Presence of intraocular lens: Secondary | ICD-10-CM | POA: Diagnosis not present

## 2021-11-26 DIAGNOSIS — R7301 Impaired fasting glucose: Secondary | ICD-10-CM | POA: Diagnosis not present

## 2021-11-26 DIAGNOSIS — E785 Hyperlipidemia, unspecified: Secondary | ICD-10-CM | POA: Diagnosis not present

## 2021-11-26 DIAGNOSIS — I10 Essential (primary) hypertension: Secondary | ICD-10-CM | POA: Diagnosis not present

## 2021-11-26 DIAGNOSIS — Z125 Encounter for screening for malignant neoplasm of prostate: Secondary | ICD-10-CM | POA: Diagnosis not present

## 2021-11-29 DIAGNOSIS — J45909 Unspecified asthma, uncomplicated: Secondary | ICD-10-CM | POA: Diagnosis not present

## 2021-11-29 DIAGNOSIS — R69 Illness, unspecified: Secondary | ICD-10-CM | POA: Diagnosis not present

## 2021-11-29 DIAGNOSIS — Z0000 Encounter for general adult medical examination without abnormal findings: Secondary | ICD-10-CM | POA: Diagnosis not present

## 2021-11-29 DIAGNOSIS — Z1331 Encounter for screening for depression: Secondary | ICD-10-CM | POA: Diagnosis not present

## 2021-11-29 DIAGNOSIS — L309 Dermatitis, unspecified: Secondary | ICD-10-CM | POA: Diagnosis not present

## 2021-11-29 DIAGNOSIS — F411 Generalized anxiety disorder: Secondary | ICD-10-CM | POA: Diagnosis not present

## 2021-11-29 DIAGNOSIS — E785 Hyperlipidemia, unspecified: Secondary | ICD-10-CM | POA: Diagnosis not present

## 2021-11-29 DIAGNOSIS — I6523 Occlusion and stenosis of bilateral carotid arteries: Secondary | ICD-10-CM | POA: Diagnosis not present

## 2021-11-29 DIAGNOSIS — I10 Essential (primary) hypertension: Secondary | ICD-10-CM | POA: Diagnosis not present

## 2021-11-29 DIAGNOSIS — Z1339 Encounter for screening examination for other mental health and behavioral disorders: Secondary | ICD-10-CM | POA: Diagnosis not present

## 2021-11-29 DIAGNOSIS — R7301 Impaired fasting glucose: Secondary | ICD-10-CM | POA: Diagnosis not present

## 2021-12-03 DIAGNOSIS — H33312 Horseshoe tear of retina without detachment, left eye: Secondary | ICD-10-CM | POA: Diagnosis not present

## 2021-12-03 DIAGNOSIS — H35033 Hypertensive retinopathy, bilateral: Secondary | ICD-10-CM | POA: Diagnosis not present

## 2021-12-03 DIAGNOSIS — H43812 Vitreous degeneration, left eye: Secondary | ICD-10-CM | POA: Diagnosis not present

## 2021-12-03 DIAGNOSIS — H43813 Vitreous degeneration, bilateral: Secondary | ICD-10-CM | POA: Diagnosis not present

## 2021-12-03 DIAGNOSIS — H3562 Retinal hemorrhage, left eye: Secondary | ICD-10-CM | POA: Diagnosis not present

## 2021-12-03 DIAGNOSIS — H4312 Vitreous hemorrhage, left eye: Secondary | ICD-10-CM | POA: Diagnosis not present

## 2022-02-01 DIAGNOSIS — M4722 Other spondylosis with radiculopathy, cervical region: Secondary | ICD-10-CM | POA: Diagnosis not present

## 2022-02-01 DIAGNOSIS — Z6829 Body mass index (BMI) 29.0-29.9, adult: Secondary | ICD-10-CM | POA: Diagnosis not present

## 2022-02-01 DIAGNOSIS — R29898 Other symptoms and signs involving the musculoskeletal system: Secondary | ICD-10-CM | POA: Diagnosis not present

## 2022-02-16 ENCOUNTER — Other Ambulatory Visit: Payer: Self-pay | Admitting: Neurological Surgery

## 2022-02-16 DIAGNOSIS — M4722 Other spondylosis with radiculopathy, cervical region: Secondary | ICD-10-CM

## 2022-02-24 DIAGNOSIS — G5602 Carpal tunnel syndrome, left upper limb: Secondary | ICD-10-CM | POA: Diagnosis not present

## 2022-02-25 ENCOUNTER — Other Ambulatory Visit: Payer: Medicare HMO

## 2022-03-03 ENCOUNTER — Ambulatory Visit
Admission: RE | Admit: 2022-03-03 | Discharge: 2022-03-03 | Disposition: A | Discharge status: Home / Self Care | Payer: Medicare HMO | Source: Ambulatory Visit | Attending: Neurological Surgery | Admitting: Neurological Surgery

## 2022-03-03 DIAGNOSIS — M4722 Other spondylosis with radiculopathy, cervical region: Secondary | ICD-10-CM | POA: Diagnosis not present

## 2022-03-03 DIAGNOSIS — M4802 Spinal stenosis, cervical region: Secondary | ICD-10-CM | POA: Diagnosis not present

## 2022-03-03 DIAGNOSIS — M4322 Fusion of spine, cervical region: Secondary | ICD-10-CM | POA: Diagnosis not present

## 2022-03-03 MED ORDER — DIAZEPAM 5 MG PO TABS
5.0000 mg | ORAL_TABLET | Freq: Once | ORAL | Status: AC
Start: 2022-03-03 — End: 2022-03-03
  Administered 2022-03-03: 5 mg via ORAL

## 2022-03-03 MED ORDER — IOPAMIDOL (ISOVUE-M 300) INJECTION 61%
10.0000 mL | Freq: Once | INTRAMUSCULAR | Status: AC
  Administered 2022-03-03: 10 mL via INTRATHECAL

## 2022-03-03 MED ORDER — ONDANSETRON HCL 4 MG/2ML IJ SOLN: 4.0000 mg | Freq: Once | INTRAMUSCULAR | Status: DC | PRN

## 2022-03-03 MED ORDER — MEPERIDINE HCL 50 MG/ML IJ SOLN: 50.0000 mg | Freq: Once | INTRAMUSCULAR | Status: DC | PRN

## 2022-03-03 NOTE — Discharge Instructions (Signed)

## 2022-03-08 DIAGNOSIS — H43813 Vitreous degeneration, bilateral: Secondary | ICD-10-CM | POA: Diagnosis not present

## 2022-03-08 DIAGNOSIS — H4312 Vitreous hemorrhage, left eye: Secondary | ICD-10-CM | POA: Diagnosis not present

## 2022-03-08 DIAGNOSIS — H35033 Hypertensive retinopathy, bilateral: Secondary | ICD-10-CM | POA: Diagnosis not present

## 2022-03-08 DIAGNOSIS — H33312 Horseshoe tear of retina without detachment, left eye: Secondary | ICD-10-CM | POA: Diagnosis not present

## 2022-03-10 DIAGNOSIS — Z6829 Body mass index (BMI) 29.0-29.9, adult: Secondary | ICD-10-CM | POA: Diagnosis not present

## 2022-03-10 DIAGNOSIS — G5622 Lesion of ulnar nerve, left upper limb: Secondary | ICD-10-CM | POA: Diagnosis not present

## 2022-03-23 DIAGNOSIS — G5622 Lesion of ulnar nerve, left upper limb: Secondary | ICD-10-CM | POA: Diagnosis not present

## 2022-08-03 DIAGNOSIS — L218 Other seborrheic dermatitis: Secondary | ICD-10-CM | POA: Diagnosis not present

## 2022-08-03 DIAGNOSIS — L2089 Other atopic dermatitis: Secondary | ICD-10-CM | POA: Diagnosis not present

## 2022-08-09 DIAGNOSIS — J449 Chronic obstructive pulmonary disease, unspecified: Secondary | ICD-10-CM | POA: Diagnosis not present

## 2022-08-09 DIAGNOSIS — Z008 Encounter for other general examination: Secondary | ICD-10-CM | POA: Diagnosis not present

## 2022-08-09 DIAGNOSIS — I10 Essential (primary) hypertension: Secondary | ICD-10-CM | POA: Diagnosis not present

## 2022-08-09 DIAGNOSIS — Z8249 Family history of ischemic heart disease and other diseases of the circulatory system: Secondary | ICD-10-CM | POA: Diagnosis not present

## 2022-08-09 DIAGNOSIS — R69 Illness, unspecified: Secondary | ICD-10-CM | POA: Diagnosis not present

## 2022-08-09 DIAGNOSIS — M1990 Unspecified osteoarthritis, unspecified site: Secondary | ICD-10-CM | POA: Diagnosis not present

## 2022-08-09 DIAGNOSIS — E785 Hyperlipidemia, unspecified: Secondary | ICD-10-CM | POA: Diagnosis not present

## 2022-08-09 DIAGNOSIS — F411 Generalized anxiety disorder: Secondary | ICD-10-CM | POA: Diagnosis not present

## 2022-08-09 DIAGNOSIS — Z87891 Personal history of nicotine dependence: Secondary | ICD-10-CM | POA: Diagnosis not present

## 2022-08-09 DIAGNOSIS — Z823 Family history of stroke: Secondary | ICD-10-CM | POA: Diagnosis not present

## 2022-08-09 DIAGNOSIS — I739 Peripheral vascular disease, unspecified: Secondary | ICD-10-CM | POA: Diagnosis not present

## 2022-08-09 DIAGNOSIS — M4800 Spinal stenosis, site unspecified: Secondary | ICD-10-CM | POA: Diagnosis not present

## 2022-08-09 DIAGNOSIS — D693 Immune thrombocytopenic purpura: Secondary | ICD-10-CM | POA: Diagnosis not present

## 2022-08-09 DIAGNOSIS — N529 Male erectile dysfunction, unspecified: Secondary | ICD-10-CM | POA: Diagnosis not present

## 2022-08-30 DIAGNOSIS — J45909 Unspecified asthma, uncomplicated: Secondary | ICD-10-CM | POA: Diagnosis not present

## 2022-08-30 DIAGNOSIS — J45901 Unspecified asthma with (acute) exacerbation: Secondary | ICD-10-CM | POA: Diagnosis not present

## 2022-08-30 DIAGNOSIS — R69 Illness, unspecified: Secondary | ICD-10-CM | POA: Diagnosis not present

## 2022-08-30 DIAGNOSIS — R051 Acute cough: Secondary | ICD-10-CM | POA: Diagnosis not present

## 2022-08-30 DIAGNOSIS — Z1152 Encounter for screening for COVID-19: Secondary | ICD-10-CM | POA: Diagnosis not present

## 2022-08-30 DIAGNOSIS — J9601 Acute respiratory failure with hypoxia: Secondary | ICD-10-CM | POA: Diagnosis not present

## 2022-08-30 DIAGNOSIS — I10 Essential (primary) hypertension: Secondary | ICD-10-CM | POA: Diagnosis not present

## 2022-08-31 DIAGNOSIS — J45909 Unspecified asthma, uncomplicated: Secondary | ICD-10-CM | POA: Diagnosis not present

## 2022-08-31 DIAGNOSIS — J45901 Unspecified asthma with (acute) exacerbation: Secondary | ICD-10-CM | POA: Diagnosis not present

## 2022-08-31 DIAGNOSIS — I10 Essential (primary) hypertension: Secondary | ICD-10-CM | POA: Diagnosis not present

## 2022-08-31 DIAGNOSIS — R051 Acute cough: Secondary | ICD-10-CM | POA: Diagnosis not present

## 2022-08-31 DIAGNOSIS — J9601 Acute respiratory failure with hypoxia: Secondary | ICD-10-CM | POA: Diagnosis not present

## 2022-09-12 DIAGNOSIS — H59812 Chorioretinal scars after surgery for detachment, left eye: Secondary | ICD-10-CM | POA: Diagnosis not present

## 2022-09-12 DIAGNOSIS — H43813 Vitreous degeneration, bilateral: Secondary | ICD-10-CM | POA: Diagnosis not present

## 2022-09-12 DIAGNOSIS — H35033 Hypertensive retinopathy, bilateral: Secondary | ICD-10-CM | POA: Diagnosis not present

## 2022-09-28 DIAGNOSIS — J45909 Unspecified asthma, uncomplicated: Secondary | ICD-10-CM | POA: Diagnosis not present

## 2022-09-28 DIAGNOSIS — I10 Essential (primary) hypertension: Secondary | ICD-10-CM | POA: Diagnosis not present

## 2022-10-03 ENCOUNTER — Encounter: Payer: Self-pay | Admitting: Pulmonary Disease

## 2022-10-03 ENCOUNTER — Ambulatory Visit: Payer: Medicare HMO | Admitting: Pulmonary Disease

## 2022-10-03 VITALS — BP 128/72 | HR 69 | Ht 72.0 in | Wt 215.0 lb

## 2022-10-03 DIAGNOSIS — R062 Wheezing: Secondary | ICD-10-CM | POA: Diagnosis not present

## 2022-10-03 DIAGNOSIS — R0609 Other forms of dyspnea: Secondary | ICD-10-CM

## 2022-10-03 LAB — NITRIC OXIDE: Nitric Oxide: 34

## 2022-10-03 NOTE — Progress Notes (Unsigned)
Synopsis: Referred in January 2024 for Asthma  Subjective:   PATIENT ID: Steve Perez GENDER: male DOB: 06-02-1951, MRN: [id number]   HPI  Chief Complaint  Patient presents with   Consult    Referred by PCP for possible asthma. Productive cough with yellowish phlegm. Increased wheezing. Denies any chest tightness.     Steve Perez is here to see me today because he developed a cough, wheeze and hypoxemia in December which lasted for several weeks.  He has no prior history of asthma or other respiratory problems.  He says that in November of this year including Thanksgiving when family came to visit he was feeling fine and there was no sick contacts.  However around the beginning of December he developed the sudden onset of chest congestion and shortness of breath.  He said he felt like he had an infection but he denied fever, chills, body aches, sinus problems at all.  He went to go see Dr. Brigitte Pulse where he was noted to be hypoxemic and wheezing.  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  He was treated with a steroid taper.  Initially he had some improvement but his symptoms returned so he had another steroid taper, similar problem developed again so he required more prednisone and eventually was started on Trelegy.  He is continue to take Trelegy and he has now been off of prednisone for a week.  He says that he currently has no respiratory complaint.  Specifically no cough, no shortness of breath or wheeze.  He has not used albuterol in over a week but he continues to take the Trelegy.  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  He never worked on the floor or in any dusty or dirty environment.  He smokes cigarettes in the 1970s but have not smoked since 1978.  He has smoked cigars over the years  but quit those back in March 2023.  Record review: Records from [facility] reviewed, [facility] and [facility].  Patient had recently had an episode of hypoxemia and an asthma exacerbation, was prescribed a nebulizer machine and was doing nebulizer machines at home every 6 hours.  Had been treated with Ladona Ridgel as well as DuoNeb.  Had previously been a smoker.  Was treated in early December with Solu-Medrol IM and a prednisone taper.  Started on Trelegy. Past Medical History:  Diagnosis Date   Anxiety    panic attacks   Arthritis    Asthma    History of bronchitis    Hypertension    Joint inflammation    thumb, bilaterally     No family history on file.   Social History   Socioeconomic History   Marital status: Married    Spouse name: Not on file   Number of children: Not on file   Years of education: Not on file   Highest education level: Not on file  Occupational History   Not on file  Tobacco Use   Smoking status: Former    Types: Cigars, Cigarettes    Quit date: 11/24/2021    Years since quitting: 0.8   Smokeless tobacco: Never   Tobacco comments:    "couple cigars a day"  Substance and Sexual Activity   Alcohol use: Yes  Alcohol/week: 1.0 standard drink of alcohol    Types: 1 Glasses of wine per week    Comment: "I drink about 2 glasses of wine a day but I haven't drank this week for surgery"   Drug use: No   Sexual activity: Not on file  Other Topics Concern   Not on file  Social History Narrative   Not on file   Social Determinants of Health   Financial Resource Strain: Not on file  Food Insecurity: Not on file  Transportation Needs: Not on file  Physical Activity: Not on file  Stress: Not on file  Social Connections: Not on file  Intimate Partner Violence: Not on file     Allergies  Allergen Reactions   Penicillin G     Other reaction(s): Unknown     Outpatient Medications Prior to Visit  Medication Sig Dispense  Refill   Ascorbic Acid (VITAMIN C PO) Take 500 mg by mouth daily.      atorvastatin (LIPITOR) 20 MG tablet Take 20 mg by mouth daily.  2   ipratropium-albuterol (DUONEB) 0.5-2.5 (3) MG/3ML SOLN Take 3 mLs by nebulization in the morning and at bedtime.     lisinopril (PRINIVIL,ZESTRIL) 20 MG tablet Take 20 mg by mouth daily.  2   meloxicam (MOBIC) 15 MG tablet Take 15 mg by mouth daily.  5   Multiple Vitamins-Minerals (MULTIVITAMIN PO) Take 1 tablet by mouth daily.     Omega-3 Fatty Acids (FISH OIL) 1200 MG CAPS Take 1,200 mg by mouth daily.      PROVENTIL HFA 108 (90 BASE) MCG/ACT inhaler Inhale 2 puffs into the lungs every 6 (six) hours as needed for wheezing or shortness of breath.   11   sertraline (ZOLOFT) 100 MG tablet Take 150 mg by mouth daily.  2   TRELEGY ELLIPTA 200-62.5-25 MCG/ACT AEPB Take 1 puff by mouth daily.     vitamin B-12 (CYANOCOBALAMIN) 1000 MCG tablet Take 1,000 mcg by mouth daily.     aspirin EC 81 MG tablet Take 81 mg by mouth daily.     Cholecalciferol (VITAMIN D) 50 MCG (2000 UT) CAPS Take 2,000 Units by mouth daily.     HYDROcodone-acetaminophen (NORCO) 5-325 MG tablet Take 1-2 tablets by mouth every 6 (six) hours as needed (pain). 30 tablet 0   HYDROcodone-acetaminophen (NORCO/VICODIN) 5-325 MG tablet Take 1-2 tablets by mouth every 4 (four) hours as needed for moderate pain. (Patient not taking: Reported on 10/14/2019) 50 tablet 0   No facility-administered medications prior to visit.    Review of Systems  Constitutional:  Negative for chills, fever, malaise/fatigue and weight loss.  HENT:  Negative for congestion, nosebleeds, sinus pain and sore throat.   Eyes:  Negative for photophobia, pain and discharge.  Respiratory:  Negative for cough, hemoptysis, sputum production, shortness of breath and wheezing.   Cardiovascular:  Negative for chest pain, palpitations, orthopnea and leg swelling.  Gastrointestinal:  Negative for abdominal pain, constipation, diarrhea,  nausea and vomiting.  Genitourinary:  Negative for dysuria, frequency, hematuria and urgency.  Musculoskeletal:  Negative for back pain, joint pain, myalgias and neck pain.  Skin:  Negative for itching and rash.  Neurological:  Negative for tingling, tremors, sensory change, speech change, focal weakness, seizures, weakness and headaches.  Psychiatric/Behavioral:  Negative for memory loss, substance abuse and suicidal ideas. The patient is not nervous/anxious.       Objective:  Physical Exam   Vitals:   10/03/22 1607  BP: 128/72  Pulse: 69  SpO2: 96%  Weight: 215 lb (97.5 kg)  Height: 6' (1.829 m)    Gen: well appearing, no acute distress HENT: NCAT, OP clear, neck supple without masses Eyes: PERRL, EOMi Lymph: no cervical lymphadenopathy PULM: Lower lobe wheezing B CV: RRR, no mgr, no JVD GI: BS+, soft, nontender, no hsm Derm: no rash or skin breakdown MSK: normal bulk and tone Neuro: A&Ox4, CN II-XII intact, strength 5/5 in all 4 extremities Psyche: normal mood and affect   CBC    Component Value Date/Time   WBC 5.6 10/21/2019 0856   RBC 3.94 (L) 10/21/2019 0856   HGB 13.2 10/21/2019 0856   HCT 39.9 10/21/2019 0856   PLT 203 10/21/2019 0856   MCV 101.3 (H) 10/21/2019 0856   MCH 33.5 10/21/2019 0856   MCHC 33.1 10/21/2019 0856   RDW 12.1 10/21/2019 0856     Chest imaging:  PFT:  Labs:  Path:  Echo:  Heart Catheterization:       Assessment & Plan:   Wheezing - Plan: CBC w/Diff, Pulmonary function test, IgE, Nitric oxide, IgE, CBC w/Diff  Dyspnea on exertion  Discussion: Mr. 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  We need to get further testing to assess further but I am very concerned that this is what happened.  Interestingly, he says this is nearly identical to what happened to an aunt of his who was a Marine scientist.  His exhaled nitric oxide test today was elevated even while taking Trelegy  and having recently completed a long course of steroids.  Plan: Severe persistent asthma with recent exacerbation: We will check a full pulmonary function test We will check blood work to look for evidence of inflammation and allergy: CBC with differential, serum IgE Continue Trelegy 1 puff daily no matter how you feel for now Use albuterol as needed for chest tightness wheezing or shortness of breath  We will see you back in 4 to 6 weeks to go over the results of the lung function test, sooner if needed  Immunizations: Immunization History  Administered Date(s) Administered   PFIZER(Purple Top)SARS-COV-2 Vaccination 11/16/2019, 12/11/2019     Current Outpatient Medications:    Ascorbic Acid (VITAMIN C PO), Take 500 mg by mouth daily. , Disp: , Rfl:    atorvastatin (LIPITOR) 20 MG tablet, Take 20 mg by mouth daily., Disp: , Rfl: 2   ipratropium-albuterol (DUONEB) 0.5-2.5 (3) MG/3ML SOLN, Take 3 mLs by nebulization in the morning and at bedtime., Disp: , Rfl:    lisinopril (PRINIVIL,ZESTRIL) 20 MG tablet, Take 20 mg by mouth daily., Disp: , Rfl: 2   meloxicam (MOBIC) 15 MG tablet, Take 15 mg by mouth daily., Disp: , Rfl: 5   Multiple Vitamins-Minerals (MULTIVITAMIN PO), Take 1 tablet by mouth daily., Disp: , Rfl:    Omega-3 Fatty Acids (FISH OIL) 1200 MG CAPS, Take 1,200 mg by mouth daily. , Disp: , Rfl:    PROVENTIL HFA 108 (90 BASE) MCG/ACT inhaler, Inhale 2 puffs into the lungs every 6 (six) hours as needed for wheezing or shortness of breath. , Disp: , Rfl: 11   sertraline (ZOLOFT) 100 MG tablet, Take 150 mg by mouth daily., Disp: , Rfl: 2   TRELEGY ELLIPTA 200-62.5-25 MCG/ACT AEPB, Take 1 puff by mouth daily., Disp: , Rfl:    vitamin B-12 (CYANOCOBALAMIN) 1000 MCG tablet, Take 1,000 mcg by mouth daily., Disp: , Rfl:

## 2022-10-03 NOTE — Patient Instructions (Signed)
Severe persistent asthma with recent exacerbation: We will check a full pulmonary function test We will check blood work to look for evidence of inflammation and allergy: CBC with differential, serum IgE Continue Trelegy 1 puff daily no matter how you feel for now Use albuterol as needed for chest tightness wheezing or shortness of breath  We will see you back in 4 to 6 weeks to go over the results of the lung function test, sooner if needed

## 2022-10-04 LAB — CBC WITH DIFFERENTIAL/PLATELET
Basophils Absolute: 0 10*3/uL (ref 0.0–0.1)
Basophils Relative: 0.5 % (ref 0.0–3.0)
Eosinophils Absolute: 0.5 10*3/uL (ref 0.0–0.7)
Eosinophils Relative: 5.8 % — ABNORMAL HIGH (ref 0.0–5.0)
HCT: 40.1 % (ref 39.0–52.0)
Hemoglobin: 13.5 g/dL (ref 13.0–17.0)
Lymphocytes Relative: 15.4 % (ref 12.0–46.0)
Lymphs Abs: 1.4 10*3/uL (ref 0.7–4.0)
MCHC: 33.8 g/dL (ref 30.0–36.0)
MCV: 95.5 fl (ref 78.0–100.0)
Monocytes Absolute: 0.6 10*3/uL (ref 0.1–1.0)
Monocytes Relative: 7.1 % (ref 3.0–12.0)
Neutro Abs: 6.3 10*3/uL (ref 1.4–7.7)
Neutrophils Relative %: 71.2 % (ref 43.0–77.0)
Platelets: 246 10*3/uL (ref 150.0–400.0)
RBC: 4.2 Mil/uL — ABNORMAL LOW (ref 4.22–5.81)
RDW: 12.5 % (ref 11.5–15.5)
WBC: 8.8 10*3/uL (ref 4.0–10.5)

## 2022-10-04 LAB — IGE: IgE (Immunoglobulin E), Serum: 22 kU/L (ref <=114)

## 2022-10-26 DIAGNOSIS — L111 Transient acantholytic dermatosis [Grover]: Secondary | ICD-10-CM | POA: Diagnosis not present

## 2022-10-26 DIAGNOSIS — L720 Epidermal cyst: Secondary | ICD-10-CM | POA: Diagnosis not present

## 2022-11-02 DIAGNOSIS — H538 Other visual disturbances: Secondary | ICD-10-CM | POA: Diagnosis not present

## 2022-11-02 DIAGNOSIS — Z961 Presence of intraocular lens: Secondary | ICD-10-CM | POA: Diagnosis not present

## 2022-11-10 ENCOUNTER — Encounter: Payer: Self-pay | Admitting: Pulmonary Disease

## 2022-11-10 ENCOUNTER — Ambulatory Visit (INDEPENDENT_AMBULATORY_CARE_PROVIDER_SITE_OTHER): Payer: Medicare HMO | Admitting: Pulmonary Disease

## 2022-11-10 ENCOUNTER — Ambulatory Visit: Payer: Medicare HMO | Admitting: Pulmonary Disease

## 2022-11-10 VITALS — BP 120/68 | HR 84 | Temp 98.1°F | Ht 72.0 in | Wt 230.0 lb

## 2022-11-10 DIAGNOSIS — R062 Wheezing: Secondary | ICD-10-CM | POA: Diagnosis not present

## 2022-11-10 DIAGNOSIS — J4550 Severe persistent asthma, uncomplicated: Secondary | ICD-10-CM

## 2022-11-10 LAB — PULMONARY FUNCTION TEST
DL/VA % pred: 95 %
DL/VA: 3.8 ml/min/mmHg/L
DLCO cor % pred: 100 %
DLCO cor: 25.57 ml/min/mmHg
DLCO unc % pred: 97 %
DLCO unc: 24.74 ml/min/mmHg
FEF 25-75 Post: 2.16 L/sec
FEF 25-75 Pre: 1.83 L/sec
FEF2575-%Change-Post: 18 %
FEF2575-%Pred-Post: 92 %
FEF2575-%Pred-Pre: 78 %
FEV1-%Change-Post: 6 %
FEV1-%Pred-Post: 91 %
FEV1-%Pred-Pre: 86 %
FEV1-Post: 2.88 L
FEV1-Pre: 2.71 L
FEV1FVC-%Change-Post: 2 %
FEV1FVC-%Pred-Pre: 94 %
FEV6-%Change-Post: 4 %
FEV6-%Pred-Post: 99 %
FEV6-%Pred-Pre: 95 %
FEV6-Post: 4.05 L
FEV6-Pre: 3.89 L
FEV6FVC-%Change-Post: 0 %
FEV6FVC-%Pred-Post: 106 %
FEV6FVC-%Pred-Pre: 105 %
FVC-%Change-Post: 3 %
FVC-%Pred-Post: 93 %
FVC-%Pred-Pre: 90 %
FVC-Post: 4.05 L
FVC-Pre: 3.92 L
Post FEV1/FVC ratio: 71 %
Post FEV6/FVC ratio: 100 %
Pre FEV1/FVC ratio: 69 %
Pre FEV6/FVC Ratio: 99 %
RV % pred: 236 %
RV: 5.94 L
TLC % pred: 150 %
TLC: 10.59 L

## 2022-11-10 NOTE — Progress Notes (Signed)
Full PFT completed today 

## 2022-11-10 NOTE — Progress Notes (Signed)
Synopsis: Referred in January 2024 for Asthma  Subjective:   PATIENT ID: Steve Perez GENDER: male DOB: 27-Aug-1951, MRN: [id number]   HPI  Chief Complaint  Patient presents with   Follow-up    Pft review   Only using albuterol a few times, still coughs up mucus in the mornings He intentionally uses the duoneb in the morning, only needs albuterol a few times a week No bronchitis or pneumonia.   Past Medical History:  Diagnosis Date   Anxiety    panic attacks   Arthritis    Asthma    History of bronchitis    Hypertension    Joint inflammation    thumb, bilaterally       Review of Systems  Constitutional:  Negative for chills, fever, malaise/fatigue and weight loss.  HENT:  Negative for congestion, sinus pain and sore throat.   Respiratory:  Negative for cough, sputum production and shortness of breath.   Cardiovascular:  Negative for chest pain and leg swelling.      Objective:  Physical Exam   Vitals:   11/10/22 1303  BP: 120/68  Pulse: 84  Temp: 98.1 F (36.7 C)  TempSrc: Oral  SpO2: 96%  Weight: 230 lb (104.3 kg)  Height: 6' (1.829 m)   Gen: well appearing HENT: OP clear, neck supple PULM: CTA B, normal effort  CV: RRR, no mgr GI: BS+, soft, nontender Derm: no cyanosis or rash Psyche: normal mood and affect    CBC    Component Value Date/Time   WBC 8.8 10/03/2022 1645   RBC 4.20 (L) 10/03/2022 1645   HGB 13.5 10/03/2022 1645   HCT 40.1 10/03/2022 1645   PLT 246.0 10/03/2022 1645   MCV 95.5 10/03/2022 1645   MCH 33.5 10/21/2019 0856   MCHC 33.8 10/03/2022 1645   RDW 12.5 10/03/2022 1645   LYMPHSABS 1.4 10/03/2022 1645   MONOABS 0.6 10/03/2022 1645   EOSABS 0.5 10/03/2022 1645   BASOSABS 0.0 10/03/2022 1645     Chest imaging:  PFT: February 2024 pulmonary function testing ratio 94%, FEV1 2.88 L 91% predicted, total lung capacity 10.59 L 150% predicted, residual volume 236% predicted, DLCO 25.57 100% predicted  Labs: 09/2022  eos 500k cell/uL, IgE normal  Path:  Echo:  Heart Catheterization:       Assessment & Plan:   Severe persistent asthma without complication  Discussion: Stable interval for Steve Perez.  He has air trapping on lung function testing but fortunately no airflow obstruction was seen today.  Overall with his significant shortness of breath, wheezing and eosinophilia I think what makes the most sense is to label him as severe persistent asthma though I suppose a postviral or postinfectious small airways bronchiolitis could explain this as well.  In general he has done well with treatment as if this is severe persistent asthma.  If symptoms worsen we could consider biologic therapy but right now do not think that is needed.  Plan: Severe persistent asthma, well-controlled: Continue Trelegy 1 puff daily no matter how you feel The DuoNeb and albuterol can be used as needed for chest tightness wheezing or shortness of breath Practice good hand hygiene Stay physically active  We will see you back in 6 months, sooner if needed  Immunizations: Immunization History  Administered Date(s) Administered   PFIZER(Purple Top)SARS-COV-2 Vaccination 11/16/2019, 12/11/2019     Current Outpatient Medications:    Ascorbic Acid (VITAMIN C PO), Take 500 mg by mouth daily. , Disp: , Rfl:  atorvastatin (LIPITOR) 20 MG tablet, Take 20 mg by mouth daily., Disp: , Rfl: 2   ipratropium-albuterol (DUONEB) 0.5-2.5 (3) MG/3ML SOLN, Take 3 mLs by nebulization in the morning and at bedtime., Disp: , Rfl:    lisinopril (PRINIVIL,ZESTRIL) 20 MG tablet, Take 20 mg by mouth daily., Disp: , Rfl: 2   meloxicam (MOBIC) 15 MG tablet, Take 15 mg by mouth daily., Disp: , Rfl: 5   Multiple Vitamins-Minerals (MULTIVITAMIN PO), Take 1 tablet by mouth daily., Disp: , Rfl:    Omega-3 Fatty Acids (FISH OIL) 1200 MG CAPS, Take 1,200 mg by mouth daily. , Disp: , Rfl:    PROVENTIL HFA 108 (90 BASE) MCG/ACT inhaler, Inhale 2 puffs  into the lungs every 6 (six) hours as needed for wheezing or shortness of breath. , Disp: , Rfl: 11   sertraline (ZOLOFT) 100 MG tablet, Take 150 mg by mouth daily., Disp: , Rfl: 2   TRELEGY ELLIPTA 200-62.5-25 MCG/ACT AEPB, Take 1 puff by mouth daily., Disp: , Rfl:    vitamin B-12 (CYANOCOBALAMIN) 1000 MCG tablet, Take 1,000 mcg by mouth daily., Disp: , Rfl:

## 2022-11-10 NOTE — Patient Instructions (Signed)
Severe persistent asthma, well-controlled: Continue Trelegy 1 puff daily no matter how you feel The DuoNeb and albuterol can be used as needed for chest tightness wheezing or shortness of breath Practice good hand hygiene Stay physically active  We will see you back in 6 months, sooner if needed

## 2022-11-23 DIAGNOSIS — Z0100 Encounter for examination of eyes and vision without abnormal findings: Secondary | ICD-10-CM | POA: Diagnosis not present

## 2022-12-05 DIAGNOSIS — I10 Essential (primary) hypertension: Secondary | ICD-10-CM | POA: Diagnosis not present

## 2022-12-05 DIAGNOSIS — Z125 Encounter for screening for malignant neoplasm of prostate: Secondary | ICD-10-CM | POA: Diagnosis not present

## 2022-12-05 DIAGNOSIS — E785 Hyperlipidemia, unspecified: Secondary | ICD-10-CM | POA: Diagnosis not present

## 2022-12-05 DIAGNOSIS — R7301 Impaired fasting glucose: Secondary | ICD-10-CM | POA: Diagnosis not present

## 2022-12-05 DIAGNOSIS — N529 Male erectile dysfunction, unspecified: Secondary | ICD-10-CM | POA: Diagnosis not present

## 2022-12-08 DIAGNOSIS — L720 Epidermal cyst: Secondary | ICD-10-CM | POA: Diagnosis not present

## 2022-12-08 DIAGNOSIS — D224 Melanocytic nevi of scalp and neck: Secondary | ICD-10-CM | POA: Diagnosis not present

## 2022-12-08 DIAGNOSIS — L570 Actinic keratosis: Secondary | ICD-10-CM | POA: Diagnosis not present

## 2022-12-08 DIAGNOSIS — D225 Melanocytic nevi of trunk: Secondary | ICD-10-CM | POA: Diagnosis not present

## 2022-12-08 DIAGNOSIS — L814 Other melanin hyperpigmentation: Secondary | ICD-10-CM | POA: Diagnosis not present

## 2022-12-08 DIAGNOSIS — D1801 Hemangioma of skin and subcutaneous tissue: Secondary | ICD-10-CM | POA: Diagnosis not present

## 2022-12-12 DIAGNOSIS — I10 Essential (primary) hypertension: Secondary | ICD-10-CM | POA: Diagnosis not present

## 2022-12-12 DIAGNOSIS — M542 Cervicalgia: Secondary | ICD-10-CM | POA: Diagnosis not present

## 2022-12-12 DIAGNOSIS — E785 Hyperlipidemia, unspecified: Secondary | ICD-10-CM | POA: Diagnosis not present

## 2022-12-12 DIAGNOSIS — I6523 Occlusion and stenosis of bilateral carotid arteries: Secondary | ICD-10-CM | POA: Diagnosis not present

## 2022-12-12 DIAGNOSIS — N289 Disorder of kidney and ureter, unspecified: Secondary | ICD-10-CM | POA: Diagnosis not present

## 2022-12-12 DIAGNOSIS — J4550 Severe persistent asthma, uncomplicated: Secondary | ICD-10-CM | POA: Diagnosis not present

## 2022-12-12 DIAGNOSIS — F17200 Nicotine dependence, unspecified, uncomplicated: Secondary | ICD-10-CM | POA: Diagnosis not present

## 2022-12-12 DIAGNOSIS — Z23 Encounter for immunization: Secondary | ICD-10-CM | POA: Diagnosis not present

## 2022-12-12 DIAGNOSIS — R011 Cardiac murmur, unspecified: Secondary | ICD-10-CM | POA: Diagnosis not present

## 2022-12-12 DIAGNOSIS — F411 Generalized anxiety disorder: Secondary | ICD-10-CM | POA: Diagnosis not present

## 2022-12-12 DIAGNOSIS — Z1331 Encounter for screening for depression: Secondary | ICD-10-CM | POA: Diagnosis not present

## 2022-12-12 DIAGNOSIS — N529 Male erectile dysfunction, unspecified: Secondary | ICD-10-CM | POA: Diagnosis not present

## 2022-12-12 DIAGNOSIS — R82998 Other abnormal findings in urine: Secondary | ICD-10-CM | POA: Diagnosis not present

## 2022-12-12 DIAGNOSIS — R7301 Impaired fasting glucose: Secondary | ICD-10-CM | POA: Diagnosis not present

## 2022-12-12 DIAGNOSIS — Z1339 Encounter for screening examination for other mental health and behavioral disorders: Secondary | ICD-10-CM | POA: Diagnosis not present

## 2022-12-12 DIAGNOSIS — Z0000 Encounter for general adult medical examination without abnormal findings: Secondary | ICD-10-CM | POA: Diagnosis not present

## 2022-12-14 ENCOUNTER — Ambulatory Visit (HOSPITAL_COMMUNITY)
Admission: RE | Admit: 2022-12-14 | Discharge: 2022-12-14 | Disposition: A | Discharge status: Home / Self Care | Payer: Medicare HMO | Source: Ambulatory Visit | Attending: Cardiology | Admitting: Cardiology

## 2022-12-14 ENCOUNTER — Other Ambulatory Visit (HOSPITAL_COMMUNITY): Payer: Self-pay | Admitting: Internal Medicine

## 2022-12-14 DIAGNOSIS — I6523 Occlusion and stenosis of bilateral carotid arteries: Secondary | ICD-10-CM | POA: Diagnosis not present

## 2022-12-16 ENCOUNTER — Other Ambulatory Visit (HOSPITAL_COMMUNITY): Payer: Self-pay | Admitting: Internal Medicine

## 2022-12-16 DIAGNOSIS — R011 Cardiac murmur, unspecified: Secondary | ICD-10-CM

## 2022-12-21 DIAGNOSIS — L309 Dermatitis, unspecified: Secondary | ICD-10-CM | POA: Diagnosis not present

## 2022-12-21 DIAGNOSIS — J4489 Other specified chronic obstructive pulmonary disease: Secondary | ICD-10-CM | POA: Diagnosis not present

## 2022-12-21 DIAGNOSIS — F411 Generalized anxiety disorder: Secondary | ICD-10-CM | POA: Diagnosis not present

## 2022-12-21 DIAGNOSIS — Z008 Encounter for other general examination: Secondary | ICD-10-CM | POA: Diagnosis not present

## 2022-12-21 DIAGNOSIS — M1990 Unspecified osteoarthritis, unspecified site: Secondary | ICD-10-CM | POA: Diagnosis not present

## 2022-12-21 DIAGNOSIS — R69 Illness, unspecified: Secondary | ICD-10-CM | POA: Diagnosis not present

## 2022-12-21 DIAGNOSIS — Z823 Family history of stroke: Secondary | ICD-10-CM | POA: Diagnosis not present

## 2022-12-21 DIAGNOSIS — I739 Peripheral vascular disease, unspecified: Secondary | ICD-10-CM | POA: Diagnosis not present

## 2022-12-21 DIAGNOSIS — F324 Major depressive disorder, single episode, in partial remission: Secondary | ICD-10-CM | POA: Diagnosis not present

## 2022-12-21 DIAGNOSIS — Z7951 Long term (current) use of inhaled steroids: Secondary | ICD-10-CM | POA: Diagnosis not present

## 2022-12-21 DIAGNOSIS — N529 Male erectile dysfunction, unspecified: Secondary | ICD-10-CM | POA: Diagnosis not present

## 2022-12-21 DIAGNOSIS — I10 Essential (primary) hypertension: Secondary | ICD-10-CM | POA: Diagnosis not present

## 2022-12-21 DIAGNOSIS — Z8249 Family history of ischemic heart disease and other diseases of the circulatory system: Secondary | ICD-10-CM | POA: Diagnosis not present

## 2022-12-21 DIAGNOSIS — E785 Hyperlipidemia, unspecified: Secondary | ICD-10-CM | POA: Diagnosis not present

## 2023-01-05 DIAGNOSIS — D485 Neoplasm of uncertain behavior of skin: Secondary | ICD-10-CM | POA: Diagnosis not present

## 2023-01-06 ENCOUNTER — Ambulatory Visit (HOSPITAL_COMMUNITY): Payer: Medicare HMO | Attending: Cardiovascular Disease

## 2023-01-06 DIAGNOSIS — R011 Cardiac murmur, unspecified: Secondary | ICD-10-CM | POA: Diagnosis not present

## 2023-01-06 LAB — ECHOCARDIOGRAM COMPLETE
AR max vel: 2.89 cm2
AV Area VTI: 2.79 cm2
AV Area mean vel: 2.69 cm2
AV Mean grad: 10.2 mmHg
AV Peak grad: 17.5 mmHg
Ao pk vel: 2.09 m/s
Area-P 1/2: 2.24 cm2
Calc EF: 61.3 %
P 1/2 time: 352 msec
S' Lateral: 3 cm
Single Plane A2C EF: 67 %
Single Plane A4C EF: 57.4 %

## 2023-01-11 ENCOUNTER — Other Ambulatory Visit (HOSPITAL_COMMUNITY): Payer: Self-pay | Admitting: Internal Medicine

## 2023-01-11 DIAGNOSIS — I739 Peripheral vascular disease, unspecified: Secondary | ICD-10-CM

## 2023-01-12 ENCOUNTER — Ambulatory Visit (HOSPITAL_COMMUNITY)
Admission: RE | Admit: 2023-01-12 | Discharge: 2023-01-12 | Disposition: A | Discharge status: Home / Self Care | Payer: Medicare HMO | Source: Ambulatory Visit | Attending: Vascular Surgery | Admitting: Vascular Surgery

## 2023-01-12 DIAGNOSIS — I739 Peripheral vascular disease, unspecified: Secondary | ICD-10-CM | POA: Diagnosis not present

## 2023-01-12 LAB — VAS US ABI WITH/WO TBI
Left ABI: 1.42
Right ABI: 1.48

## 2023-02-09 DIAGNOSIS — L720 Epidermal cyst: Secondary | ICD-10-CM | POA: Diagnosis not present

## 2023-02-09 DIAGNOSIS — G5622 Lesion of ulnar nerve, left upper limb: Secondary | ICD-10-CM | POA: Diagnosis not present

## 2023-02-09 DIAGNOSIS — L281 Prurigo nodularis: Secondary | ICD-10-CM | POA: Diagnosis not present

## 2023-02-14 IMAGING — XA DG MYELOGRAPHY LUMBAR INJ CERVICAL
10 series · 10 of 10 positions shown · non-contrast
Comparison: Cervical spine MRI 10/10/2019

CLINICAL DATA: Cervical spondylosis with radiculopathy. Numbness
involving the small finger of the left hand. Tingling involving the
right face when turning the head. Prior cervical fusion.
TECHNIQUE: Contiguous axial images were obtained through the cervical spine
after the intrathecal infusion of contrast. Coronal and sagittal
reconstructions were obtained of the axial image sets.

[Series 1: vasc adipose · 1 of 1 slices shown (1 of 10)]
[im 1/1]
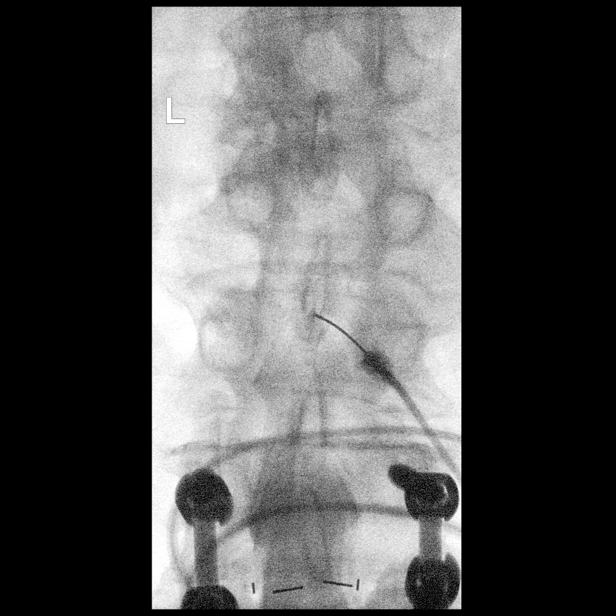

[Series 4: vasc adipose · 1 of 1 slices shown (2 of 10)]
[im 1/1]
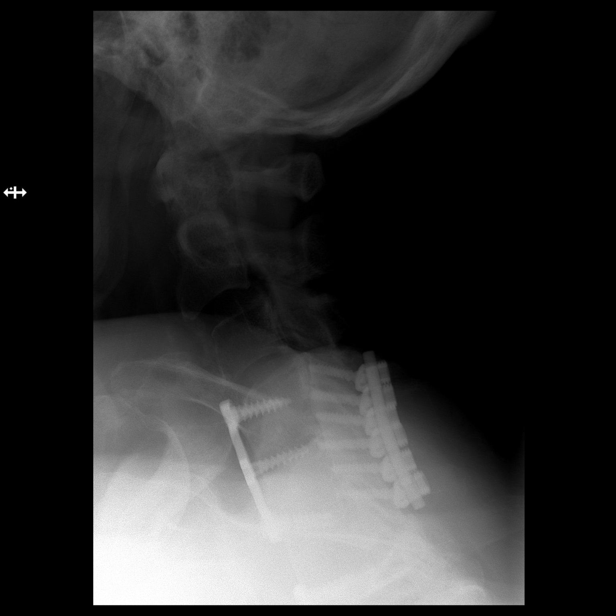

[Series 5: vasc adipose · 1 of 1 slices shown (3 of 10)]
[im 1/1]
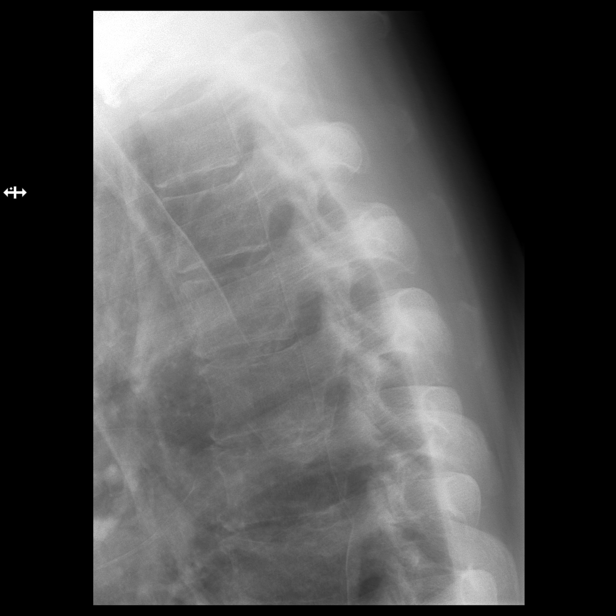

[Series 7: vasc adipose · 1 of 1 slices shown (4 of 10)]
[im 1/1]
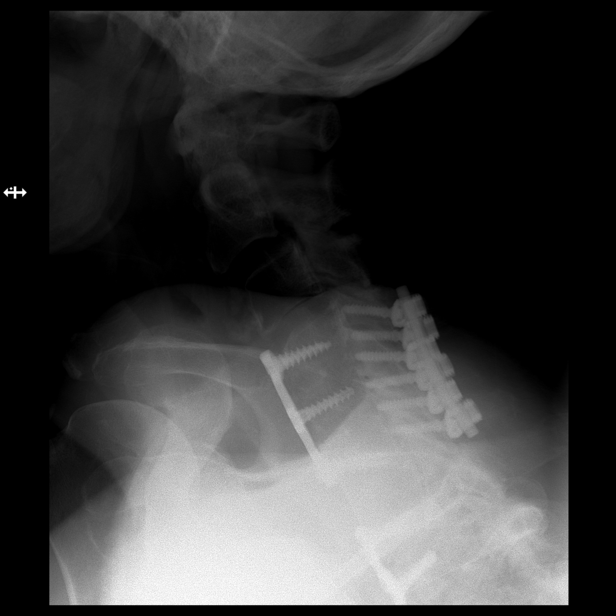

[Series 8: vasc adipose · 1 of 1 slices shown (5 of 10)]
[im 1/1]
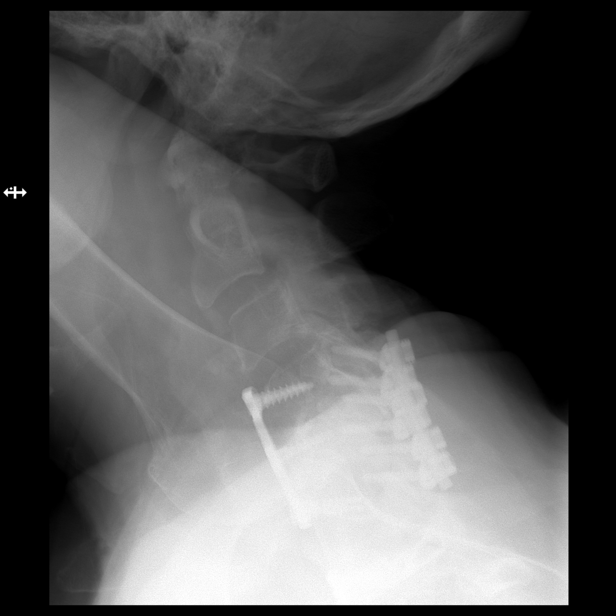

[Series 9: vasc adipose · 1 of 1 slices shown (6 of 10)]
[im 1/1]
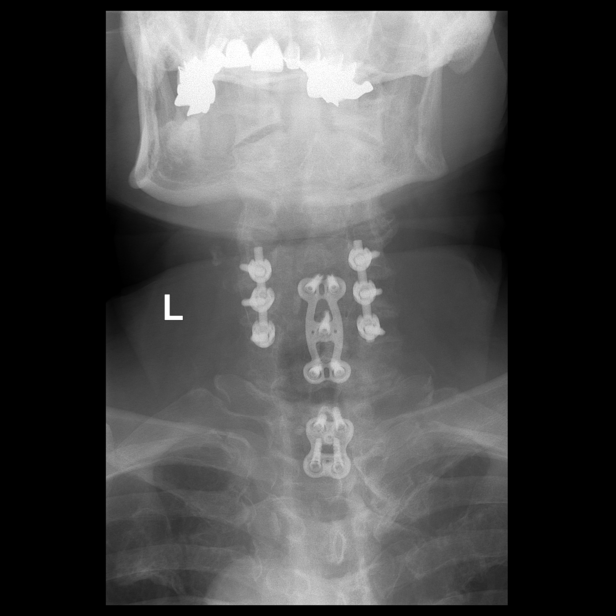

[Series 10: vasc adipose · 1 of 1 slices shown (7 of 10)]
[im 1/1]
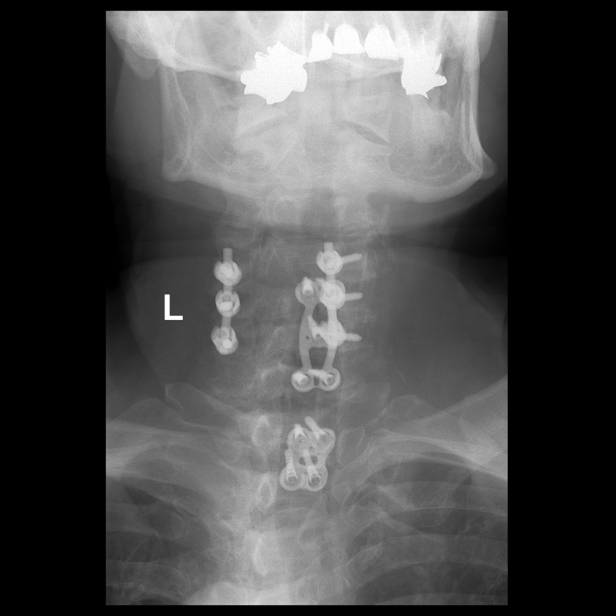

[Series 11: vasc adipose · 1 of 1 slices shown (8 of 10)]
[im 1/1]
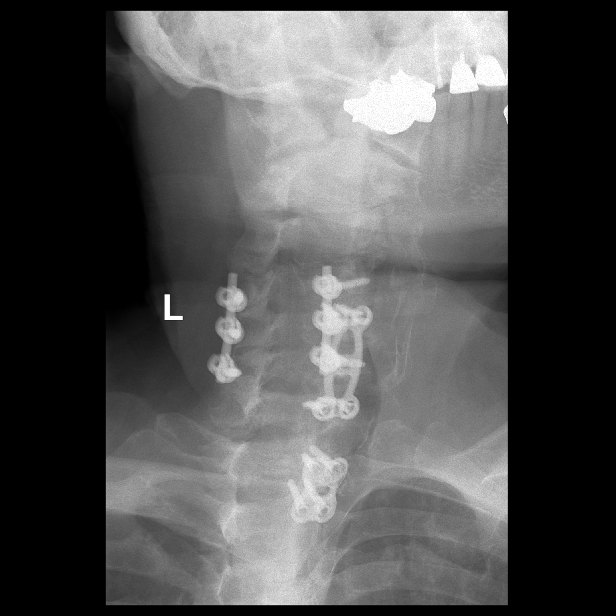

[Series 12: vasc adipose · 1 of 1 slices shown (9 of 10)]
[im 1/1]
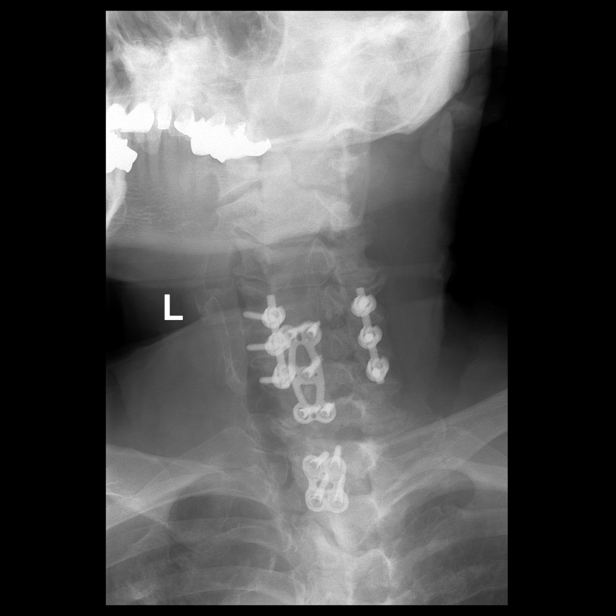

[Series 13: vasc adipose · 1 of 1 slices shown (10 of 10)]
[im 1/1]
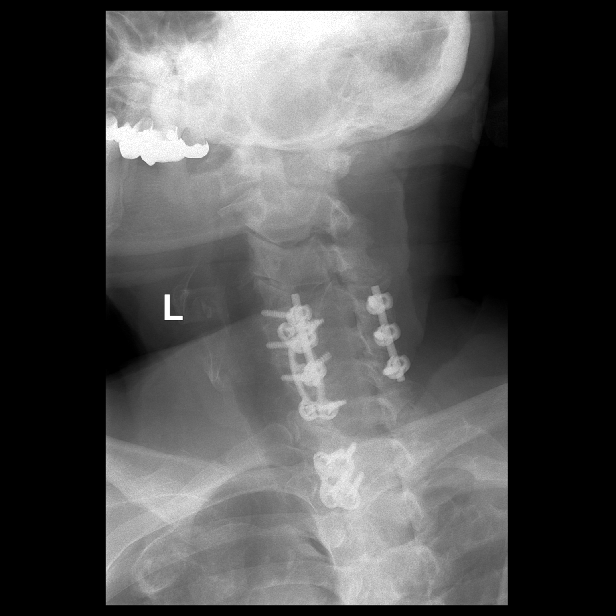

[10 of 10 positions shown; findings below may reference images not displayed]

EXAM:
CERVICAL MYELOGRAM

CT CERVICAL MYELOGRAM

FLUOROSCOPY:
Radiation Exposure Index: 34.0 mGy

PROCEDURE:
LUMBAR PUNCTURE FOR CERVICAL MYELOGRAM

After thorough discussion of risks and benefits of the procedure
including bleeding, infection, injury to nerves, blood vessels,
adjacent structures as well as headache and CSF leak, written and
oral informed consent was obtained. Consent was obtained by Dr.
Monikaa Gorden. We discussed the high likelihood of obtaining a
diagnostic study.

Patient was positioned prone on the fluoroscopy table. Local
anesthesia was provided with 1% lidocaine without epinephrine after
prepped and draped in the usual sterile fashion. Puncture was
performed at L2-3 using a 3 1/2 inch 22-gauge spinal needle via a
right interlaminar approach. Using a single pass through the dura,
the needle was placed within the thecal sac, with return of clear
CSF. 10 mL of Isovue [id number] was injected into the thecal sac, with
normal opacification of the nerve roots and cauda equina consistent
with free flow within the subarachnoid space. The patient was then
moved to the Trendelenburg position and contrast flowed into the
cervical spine region.

I personally performed the lumbar puncture and administered the
intrathecal contrast. I also personally supervised acquisition of
the myelogram images.
FINDINGS: CERVICAL MYELOGRAM FINDINGS:

C4-T1 ACDF and C4-C6 posterior fusion are noted. There is grade 1
anterolisthesis of C2 on C3, C3 on C4, and C7 on T1. Ventral
extradural defects are present at C2-3 and C3-4 without evidence of
high-grade spinal stenosis. A small ventral extradural defect is
also incidentally noted in the midthoracic spine, incompletely
evaluated on this cervical study.

CT CERVICAL MYELOGRAM FINDINGS:

Grade 1 anterolisthesis of C2 on C3 is new or increased compared to
the prior MRI and measures 3 mm. Trace anterolisthesis of C3 on C4
is unchanged. Anterolisthesis of C7 on T1 has increased and now
measures 6 mm. No acute fracture or suspicious osseous lesion is
identified.

Since the prior MRI, ACDF has been performed at C7-T1. There is
subsidence of the interbody spacer with lucency throughout most of
the disc space and sclerosis of the endplates. A small amount of
bridging bone is questioned across the anterior most aspect of the
disc space.

Sequelae of C4-C7 ACDF is again identified with solid interbody
arthrodesis at each level. An anterior fusion plate and screws
remain in place from C4-C6. There is also been prior C4-C6 posterior
fusion with facet ankylosis bilaterally at these levels as well as
at C6-7. There is no evidence of screw loosening.

There is a moderate amount of calcified atherosclerotic plaque at
the carotid bifurcations. Minimal scarring is noted in the lung
apices.

C2-3: Anterolisthesis with bulging uncovered disc, asymmetric right
uncovertebral spurring, and severe right and mild left facet
arthrosis result in new mild spinal stenosis and moderate right
neural foraminal stenosis.

C3-4: Anterolisthesis with bulging uncovered disc, right greater
than left uncovertebral spurring, and severe right and moderate left
facet arthrosis result in severe right and mild left neural
foraminal stenosis without significant spinal stenosis, unchanged.

C4-5: ACDF and posterior fusion.  No stenosis.

C5-6: ACDF and posterior fusion. Patent spinal canal and left neural
foramen. Mild to moderate osseous neural foraminal stenosis on the
right.

C6-7: ACDF.  No stenosis.

C7-T1: ACDF. Anterolisthesis, severe disc space height loss, right
uncovertebral spurring, and severe bilateral facet arthrosis result
in increased, moderate right neural foraminal stenosis. No
significant residual spinal canal or left neural foraminal stenosis.

T1-2: Mild right facet arthrosis without stenosis.
IMPRESSION: 1. Interval C7-T1 ACDF without evidence of solid arthrodesis.
Increased anterolisthesis and moderate right neural foraminal
stenosis.
2. New grade 1 anterolisthesis, mild spinal stenosis, and moderate
right neural foraminal stenosis at C2-3.
3. Unchanged severe right neural foraminal stenosis at C3-4.

## 2023-03-15 DIAGNOSIS — L723 Sebaceous cyst: Secondary | ICD-10-CM | POA: Diagnosis not present

## 2023-03-15 DIAGNOSIS — E785 Hyperlipidemia, unspecified: Secondary | ICD-10-CM | POA: Diagnosis not present

## 2023-05-14 ENCOUNTER — Emergency Department (HOSPITAL_BASED_OUTPATIENT_CLINIC_OR_DEPARTMENT_OTHER)
Admission: EM | Admit: 2023-05-14 | Discharge: 2023-05-14 | Discharge status: Against Medical Advice | Payer: Medicare HMO | Attending: Emergency Medicine | Admitting: Emergency Medicine

## 2023-05-14 ENCOUNTER — Other Ambulatory Visit: Payer: Self-pay

## 2023-05-14 ENCOUNTER — Encounter (HOSPITAL_BASED_OUTPATIENT_CLINIC_OR_DEPARTMENT_OTHER): Payer: Self-pay

## 2023-05-14 DIAGNOSIS — S50862A Insect bite (nonvenomous) of left forearm, initial encounter: Secondary | ICD-10-CM | POA: Diagnosis not present

## 2023-05-14 DIAGNOSIS — Z5321 Procedure and treatment not carried out due to patient leaving prior to being seen by health care provider: Secondary | ICD-10-CM | POA: Diagnosis not present

## 2023-05-14 DIAGNOSIS — W57XXXA Bitten or stung by nonvenomous insect and other nonvenomous arthropods, initial encounter: Secondary | ICD-10-CM | POA: Diagnosis not present

## 2023-05-14 NOTE — ED Provider Notes (Signed)
Patient left without being seen  1. Patient left without being seen       Melene Plan, DO 05/14/23 1910

## 2023-05-14 NOTE — ED Triage Notes (Signed)
Onset Friday cleaning cutters and bit on left forearm three times.  Note three swollen areas.  Appears in NAD

## 2023-05-15 ENCOUNTER — Encounter: Payer: Self-pay | Admitting: Pulmonary Disease

## 2023-05-15 ENCOUNTER — Ambulatory Visit: Payer: Medicare HMO | Admitting: Pulmonary Disease

## 2023-05-15 VITALS — BP 128/74 | HR 69 | Ht 72.0 in | Wt 219.0 lb

## 2023-05-15 DIAGNOSIS — J4540 Moderate persistent asthma, uncomplicated: Secondary | ICD-10-CM | POA: Diagnosis not present

## 2023-05-15 MED ORDER — TRELEGY ELLIPTA 100-62.5-25 MCG/ACT IN AEPB: 1.0000 | INHALATION_SPRAY | Freq: Every day | RESPIRATORY_TRACT | 11 refills | Status: AC

## 2023-05-15 NOTE — Patient Instructions (Signed)
I am glad you are doing well with your breathing Will reduce the Trelegy to 100 Follow-up in 6 months

## 2023-05-15 NOTE — Progress Notes (Signed)
Steve Perez    [id number]    February 27, 1951  Primary Care Physician:Shaw, Netta Corrigan., MD  Referring Physician: Cleatis Polka., MD [address] [place],  [place] [address]  Chief complaint: Follow-up for severe persistent asthma  HPI: 72 y.o. who  has a past medical history of Anxiety, Arthritis, Asthma, History of bronchitis, Hypertension, and Joint inflammation.   Initially seen in pulmonary clinic in January 2023 after he developed an asthma exacerbation secondary to a viral illness which developed December 2023.  He had dyspnea, hypoxemia, wheezing.  Treated with steroids multiple times and eventually started on Trelegy inhaler with slow improvement in symptoms. Since that time he has been stable and continuing on the Trelegy inhaler.  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  No feather pillows or comforters Smoking history:Quit smoking in 1978.  Smokes cigars till 2023 Travel history: No significant travel history Relevant family history: No family history of lung disease   Outpatient Encounter Medications as of 05/15/2023  Medication Sig   Ascorbic Acid (VITAMIN C PO) Take 500 mg by mouth daily.    atorvastatin (LIPITOR) 40 MG tablet Take 40 mg by mouth daily.   lisinopril (PRINIVIL,ZESTRIL) 20 MG tablet Take 20 mg by mouth daily.   Multiple Vitamins-Minerals (MULTIVITAMIN PO) Take 1 tablet by mouth daily.   Omega-3 Fatty Acids (FISH OIL) 1200 MG CAPS Take 1,200 mg by mouth daily.    PROVENTIL HFA 108 (90 BASE) MCG/ACT inhaler Inhale 2 puffs into the lungs every 6 (six) hours as needed for wheezing or shortness of breath.    sertraline (ZOLOFT) 100 MG tablet Take 150 mg by mouth daily.   TRELEGY ELLIPTA 200-62.5-25 MCG/ACT AEPB Take 1 puff by mouth daily.   vitamin B-12 (CYANOCOBALAMIN) 1000 MCG tablet Take 1,000 mcg by mouth daily.   [DISCONTINUED] atorvastatin  (LIPITOR) 20 MG tablet Take 20 mg by mouth daily.   [DISCONTINUED] ipratropium-albuterol (DUONEB) 0.5-2.5 (3) MG/3ML SOLN Take 3 mLs by nebulization in the morning and at bedtime.   [DISCONTINUED] meloxicam (MOBIC) 15 MG tablet Take 15 mg by mouth daily.   No facility-administered encounter medications on file as of 05/15/2023.    Allergies as of 05/15/2023   (No Known Allergies)    Past Medical History:  Diagnosis Date   Anxiety    panic attacks   Arthritis    Asthma    History of bronchitis    Hypertension    Joint inflammation    thumb, bilaterally    Past Surgical History:  Procedure Laterality Date   ANTERIOR CERVICAL DECOMP/DISCECTOMY FUSION N/A 11/20/2014   Procedure: ANTERIOR CERVICAL DECOMPRESSION/DISCECTOMY FUSION CERVICAL FOUR-FIVE,CERVICAL FIVE-SIX WITH/HARDWARE REMOVAL  AT CERVICAL FIVE-SIX,CERVICAL SIX-SEVEN.;  Surgeon: Hewitt Shorts, MD;  Location: [facility];  Service: Neurosurgery;  Laterality: N/A;   ANTERIOR CERVICAL DECOMP/DISCECTOMY FUSION N/A 10/23/2019   Procedure: ANTERIOR CERVICAL DECOMPRESSION/DISCECTOMY FUSION  CERVICAL SEVEN- THORACIC ONE;  Surgeon: Shirlean Kelly, MD;  Location: [facility] OR;  Service: Neurosurgery;  Laterality: N/A;  anterior   arthroscopic knee Left    BACK SURGERY     CERVICAL FUSION     COLONOSCOPY     FRACTURE SURGERY Right    right ankle   POSTERIOR CERVICAL FUSION/FORAMINOTOMY N/A 08/10/2015   Procedure: CERVICAL FOUR-FIVE, CERVICAL FIVE-SIX POSTERIOR CERVICAL FUSION/FORAMINOTOMY ;  Surgeon: Shirlean Kelly, MD;  Location: [facility];  Service: Neurosurgery;  Laterality: N/A;  C4-C6 posterior cervical fusion with instrumentation   TONSILLECTOMY      No family history on file.  Social History   Socioeconomic History   Marital status: Married    Spouse name: Not on file   Number of children: Not on file   Years of education: Not on file   Highest education level: Not on file  Occupational History   Not on file   Tobacco Use   Smoking status: Former    Types: Cigars, Cigarettes    Quit date: 11/24/2021    Years since quitting: 1.4   Smokeless tobacco: Never   Tobacco comments:    "couple cigars a day"  Substance and Sexual Activity   Alcohol use: Not Currently    Alcohol/week: 1.0 standard drink of alcohol    Types: 1 Glasses of wine per week    Comment: "I drink about 2 glasses of wine a day but I haven't drank this week for surgery"   Drug use: No   Sexual activity: Not on file  Other Topics Concern   Not on file  Social History Narrative   Not on file   Social Determinants of Health   Financial Resource Strain: Not on file  Food Insecurity: Not on file  Transportation Needs: Not on file  Physical Activity: Not on file  Stress: Not on file  Social Connections: Not on file  Intimate Partner Violence: Not on file    Review of systems: Review of Systems  Constitutional: Negative for fever and chills.  HENT: Negative.   Eyes: Negative for blurred vision.  Respiratory: as per HPI  Cardiovascular: Negative for chest pain and palpitations.  Gastrointestinal: Negative for vomiting, diarrhea, blood per rectum. Genitourinary: Negative for dysuria, urgency, frequency and hematuria.  Musculoskeletal: Negative for myalgias, back pain and joint pain.  Skin: Negative for itching and rash.  Neurological: Negative for dizziness, tremors, focal weakness, seizures and loss of consciousness.  Endo/Heme/Allergies: Negative for environmental allergies.  Psychiatric/Behavioral: Negative for depression, suicidal ideas and hallucinations.  All other systems reviewed and are negative.  Physical Exam: Blood pressure 128/74, pulse 69, height 6' (1.829 m), weight 219 lb (99.3 kg), SpO2 99%. Gen:      No acute distress HEENT:  EOMI, sclera anicteric Neck:     No masses; no thyromegaly Lungs:    Clear to auscultation bilaterally; normal respiratory effort CV:         Regular rate and rhythm; no  murmurs Abd:      + bowel sounds; soft, non-tender; no palpable masses, no distension Ext:    No edema; adequate peripheral perfusion Skin:      Warm and dry; no rash Neuro: alert and oriented x 3 Psych: normal mood and affect  Data Reviewed: Imaging:   PFTs: 11/10/2022 FVC 4.05 [93%], FEV1 2.88 [91%], F/F71, TLC 10.59 [150%], DLCO 24.74 [97%] Mid airflow obstruction with hyperinflation  Labs: CBC with differential 10/03/2022-WBC 8.8, eos 5.8%, absolute eosinophil count 510 IgE 10/03/2022- 22  Assessment:  Moderate persistent asthma Symptoms brought on by what appears to be a viral infection and December 2024 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  If he is stable on those then we can switch to LABA/LAMA later this year.  I am not sure if he needs to be on a controller medication for long-term but will assess going forward.  Plan/Recommendations: De-escalate inhalers to  Trelegy 100  Chilton Greathouse MD [facility] 05/15/2023, 10:44 AM  CC: Cleatis Polka., MD

## 2023-05-19 DIAGNOSIS — N528 Other male erectile dysfunction: Secondary | ICD-10-CM | POA: Diagnosis not present

## 2023-05-19 DIAGNOSIS — N486 Induration penis plastica: Secondary | ICD-10-CM | POA: Diagnosis not present

## 2023-05-19 DIAGNOSIS — N433 Hydrocele, unspecified: Secondary | ICD-10-CM | POA: Diagnosis not present

## 2023-05-22 ENCOUNTER — Telehealth: Payer: Self-pay

## 2023-05-22 NOTE — Telephone Encounter (Signed)
Transition Care Management Unsuccessful Follow-up Telephone Call  Date of discharge and from where:  [facility] 8/18  Attempts:  1st Attempt  Reason for unsuccessful TCM follow-up call:  No answer/busy   Steve Perez [place]  [facility], [facility] Guide, Phone: [phone] Website: [email or web address]

## 2023-05-23 ENCOUNTER — Telehealth: Payer: Self-pay

## 2023-05-23 NOTE — Telephone Encounter (Signed)
Transition Care Management Unsuccessful Follow-up Telephone Call  Date of discharge and from where:  [facility] 8/18  Attempts:  2nd Attempt  Reason for unsuccessful TCM follow-up call:  No answer/busy   [place]  [facility], [facility] Guide, Phone: [phone] Website: [email or web address]

## 2023-06-08 DIAGNOSIS — N528 Other male erectile dysfunction: Secondary | ICD-10-CM | POA: Diagnosis not present

## 2023-06-22 DIAGNOSIS — L720 Epidermal cyst: Secondary | ICD-10-CM | POA: Diagnosis not present

## 2023-11-07 DIAGNOSIS — R0683 Snoring: Secondary | ICD-10-CM | POA: Diagnosis not present

## 2023-11-07 DIAGNOSIS — F17200 Nicotine dependence, unspecified, uncomplicated: Secondary | ICD-10-CM | POA: Diagnosis not present

## 2023-11-07 DIAGNOSIS — J45909 Unspecified asthma, uncomplicated: Secondary | ICD-10-CM | POA: Diagnosis not present

## 2023-11-07 DIAGNOSIS — I10 Essential (primary) hypertension: Secondary | ICD-10-CM | POA: Diagnosis not present

## 2023-11-07 DIAGNOSIS — J45901 Unspecified asthma with (acute) exacerbation: Secondary | ICD-10-CM | POA: Diagnosis not present

## 2023-11-07 DIAGNOSIS — R011 Cardiac murmur, unspecified: Secondary | ICD-10-CM | POA: Diagnosis not present

## 2023-11-13 DIAGNOSIS — H538 Other visual disturbances: Secondary | ICD-10-CM | POA: Diagnosis not present

## 2023-11-13 DIAGNOSIS — Z961 Presence of intraocular lens: Secondary | ICD-10-CM | POA: Diagnosis not present

## 2023-11-27 DIAGNOSIS — R0683 Snoring: Secondary | ICD-10-CM | POA: Diagnosis not present

## 2023-11-30 DIAGNOSIS — G4733 Obstructive sleep apnea (adult) (pediatric): Secondary | ICD-10-CM | POA: Diagnosis not present

## 2023-12-04 DIAGNOSIS — R059 Cough, unspecified: Secondary | ICD-10-CM | POA: Diagnosis not present

## 2023-12-04 DIAGNOSIS — G4733 Obstructive sleep apnea (adult) (pediatric): Secondary | ICD-10-CM | POA: Diagnosis not present

## 2023-12-08 DIAGNOSIS — R7301 Impaired fasting glucose: Secondary | ICD-10-CM | POA: Diagnosis not present

## 2023-12-08 DIAGNOSIS — Z125 Encounter for screening for malignant neoplasm of prostate: Secondary | ICD-10-CM | POA: Diagnosis not present

## 2023-12-08 DIAGNOSIS — E785 Hyperlipidemia, unspecified: Secondary | ICD-10-CM | POA: Diagnosis not present

## 2023-12-08 DIAGNOSIS — G4733 Obstructive sleep apnea (adult) (pediatric): Secondary | ICD-10-CM | POA: Diagnosis not present

## 2023-12-08 DIAGNOSIS — I10 Essential (primary) hypertension: Secondary | ICD-10-CM | POA: Diagnosis not present
